# Patient Record
Sex: Female | Born: 1960 | Race: White | Hispanic: No | Marital: Married | Smoking: Former smoker
Health system: Southern US, Community
[De-identification: ages and names within clinical notes are randomized; demographics above are authoritative.]

## PROBLEM LIST (undated history)

## (undated) ENCOUNTER — Emergency Department (HOSPITAL_COMMUNITY): Disposition: A | Discharge status: Home / Self Care | Payer: Managed Care, Other (non HMO)

## (undated) DIAGNOSIS — I2699 Other pulmonary embolism without acute cor pulmonale: Secondary | ICD-10-CM

## (undated) DIAGNOSIS — K219 Gastro-esophageal reflux disease without esophagitis: Secondary | ICD-10-CM

## (undated) DIAGNOSIS — G629 Polyneuropathy, unspecified: Secondary | ICD-10-CM

## (undated) DIAGNOSIS — L719 Rosacea, unspecified: Secondary | ICD-10-CM

## (undated) DIAGNOSIS — I639 Cerebral infarction, unspecified: Secondary | ICD-10-CM

## (undated) DIAGNOSIS — K52839 Microscopic colitis, unspecified: Secondary | ICD-10-CM

## (undated) DIAGNOSIS — Z8674 Personal history of sudden cardiac arrest: Secondary | ICD-10-CM

## (undated) HISTORY — DX: Polyneuropathy, unspecified: G62.9

## (undated) HISTORY — PX: CANNULATION FOR CARDIOPULMONARY BYPASS: SHX6411

## (undated) HISTORY — DX: Microscopic colitis, unspecified: K52.839

## (undated) HISTORY — DX: Cerebral infarction, unspecified: I63.9

## (undated) HISTORY — DX: Personal history of sudden cardiac arrest: Z86.74

## (undated) HISTORY — DX: Rosacea, unspecified: L71.9

## (undated) HISTORY — PX: WRIST SURGERY: SHX841

---

## 2010-11-15 HISTORY — PX: COLONOSCOPY WITH ESOPHAGOGASTRODUODENOSCOPY (EGD): SHX5779

## 2012-11-15 HISTORY — PX: IVC FILTER INSERTION: CATH118245

## 2015-04-26 ENCOUNTER — Encounter: Payer: Self-pay | Admitting: Emergency Medicine

## 2015-04-26 ENCOUNTER — Emergency Department
Admission: EM | Admit: 2015-04-26 | Discharge: 2015-04-26 | Disposition: A | Discharge status: Home / Self Care | Payer: Managed Care, Other (non HMO) | Attending: Emergency Medicine | Admitting: Emergency Medicine

## 2015-04-26 ENCOUNTER — Emergency Department: Payer: Managed Care, Other (non HMO)

## 2015-04-26 DIAGNOSIS — S00211A Abrasion of right eyelid and periocular area, initial encounter: Secondary | ICD-10-CM | POA: Diagnosis not present

## 2015-04-26 DIAGNOSIS — S0081XA Abrasion of other part of head, initial encounter: Secondary | ICD-10-CM | POA: Diagnosis not present

## 2015-04-26 DIAGNOSIS — Y9389 Activity, other specified: Secondary | ICD-10-CM | POA: Insufficient documentation

## 2015-04-26 DIAGNOSIS — Y998 Other external cause status: Secondary | ICD-10-CM | POA: Insufficient documentation

## 2015-04-26 DIAGNOSIS — T07XXXA Unspecified multiple injuries, initial encounter: Secondary | ICD-10-CM

## 2015-04-26 DIAGNOSIS — Z87891 Personal history of nicotine dependence: Secondary | ICD-10-CM | POA: Diagnosis not present

## 2015-04-26 DIAGNOSIS — Y9289 Other specified places as the place of occurrence of the external cause: Secondary | ICD-10-CM | POA: Insufficient documentation

## 2015-04-26 DIAGNOSIS — W01198A Fall on same level from slipping, tripping and stumbling with subsequent striking against other object, initial encounter: Secondary | ICD-10-CM | POA: Diagnosis not present

## 2015-04-26 DIAGNOSIS — S0990XA Unspecified injury of head, initial encounter: Secondary | ICD-10-CM

## 2015-04-26 DIAGNOSIS — S6991XA Unspecified injury of right wrist, hand and finger(s), initial encounter: Secondary | ICD-10-CM | POA: Insufficient documentation

## 2015-04-26 DIAGNOSIS — S80811A Abrasion, right lower leg, initial encounter: Secondary | ICD-10-CM | POA: Diagnosis not present

## 2015-04-26 DIAGNOSIS — Z23 Encounter for immunization: Secondary | ICD-10-CM | POA: Diagnosis not present

## 2015-04-26 HISTORY — DX: Other pulmonary embolism without acute cor pulmonale: I26.99

## 2015-04-26 MED ORDER — ACETAMINOPHEN 325 MG PO TABS
650.0000 mg | ORAL_TABLET | Freq: Once | ORAL | Status: AC
  Administered 2015-04-26: 650 mg via ORAL

## 2015-04-26 MED ORDER — BACITRACIN 500 UNIT/GM EX OINT
1.0000 "application " | TOPICAL_OINTMENT | Freq: Two times a day (BID) | CUTANEOUS | Status: DC
  Administered 2015-04-26: 1 via TOPICAL

## 2015-04-26 MED ORDER — TETANUS-DIPHTH-ACELL PERTUSSIS 5-2.5-18.5 LF-MCG/0.5 IM SUSP
0.5000 mL | Freq: Once | INTRAMUSCULAR | Status: AC
  Administered 2015-04-26: 0.5 mL via INTRAMUSCULAR

## 2015-04-26 MED ORDER — BACITRACIN ZINC 500 UNIT/GM EX OINT
TOPICAL_OINTMENT | CUTANEOUS | Status: AC
  Administered 2015-04-26: 1 via TOPICAL
  Filled 2015-04-26: qty 0.9

## 2015-04-26 MED ORDER — ACETAMINOPHEN 325 MG PO TABS
ORAL_TABLET | ORAL | Status: AC
  Administered 2015-04-26: 650 mg via ORAL
  Filled 2015-04-26: qty 2

## 2015-04-26 MED ORDER — TETANUS-DIPHTH-ACELL PERTUSSIS 5-2.5-18.5 LF-MCG/0.5 IM SUSP
INTRAMUSCULAR | Status: AC
  Administered 2015-04-26: 0.5 mL via INTRAMUSCULAR
  Filled 2015-04-26: qty 0.5

## 2015-04-26 NOTE — ED Notes (Signed)
Pt is on eliquis, pt missed the curb, fell hitting her head, face, and rt knee, pt states that she needs to have her head checked because of the blood thinner she is on, pt denies loc

## 2015-04-26 NOTE — Discharge Instructions (Signed)
Concussion A concussion, or closed-head injury, is a brain injury caused by a direct blow to the head or by a quick and sudden movement (jolt) of the head or neck. Concussions are usually not life-threatening. Even so, the effects of a concussion can be serious. If you have had a concussion before, you are more likely to experience concussion-like symptoms after a direct blow to the head.  CAUSES  Direct blow to the head, such as from running into another player during a soccer game, being hit in a fight, or hitting your head on a hard surface.  A jolt of the head or neck that causes the brain to move back and forth inside the skull, such as in a car crash. SIGNS AND SYMPTOMS The signs of a concussion can be hard to notice. Early on, they may be missed by you, family members, and health care providers. You may look fine but act or feel differently. Symptoms are usually temporary, but they may last for days, weeks, or even longer. Some symptoms may appear right away while others may not show up for hours or days. Every head injury is different. Symptoms include:  Mild to moderate headaches that will not go away.  A feeling of pressure inside your head.  Having more trouble than usual:  Learning or remembering things you have heard.  Answering questions.  Paying attention or concentrating.  Organizing daily tasks.  Making decisions and solving problems.  Slowness in thinking, acting or reacting, speaking, or reading.  Getting lost or being easily confused.  Feeling tired all the time or lacking energy (fatigued).  Feeling drowsy.  Sleep disturbances.  Sleeping more than usual.  Sleeping less than usual.  Trouble falling asleep.  Trouble sleeping (insomnia).  Loss of balance or feeling lightheaded or dizzy.  Nausea or vomiting.  Numbness or tingling.  Increased sensitivity to:  Sounds.  Lights.  Distractions.  Vision problems or eyes that tire  easily.  Diminished sense of taste or smell.  Ringing in the ears.  Mood changes such as feeling sad or anxious.  Becoming easily irritated or angry for little or no reason.  Lack of motivation.  Seeing or hearing things other people do not see or hear (hallucinations). DIAGNOSIS Your health care provider can usually diagnose a concussion based on a description of your injury and symptoms. He or she will ask whether you passed out (lost consciousness) and whether you are having trouble remembering events that happened right before and during your injury. Your evaluation might include:  A brain scan to look for signs of injury to the brain. Even if the test shows no injury, you may still have a concussion.  Blood tests to be sure other problems are not present. TREATMENT  Concussions are usually treated in an emergency department, in urgent care, or at a clinic. You may need to stay in the hospital overnight for further treatment.  Tell your health care provider if you are taking any medicines, including prescription medicines, over-the-counter medicines, and natural remedies. Some medicines, such as blood thinners (anticoagulants) and aspirin, may increase the chance of complications. Also tell your health care provider whether you have had alcohol or are taking illegal drugs. This information may affect treatment.  Your health care provider will send you home with important instructions to follow.  How fast you will recover from a concussion depends on many factors. These factors include how severe your concussion is, what part of your brain was injured, your  age, and how healthy you were before the concussion. °· Most people with mild injuries recover fully. Recovery can take time. In general, recovery is slower in older persons. Also, persons who have had a concussion in the past or have other medical problems may find that it takes longer to recover from their current injury. °HOME  CARE INSTRUCTIONS °General Instructions °· Carefully follow the directions your health care provider gave you. °· Only take over-the-counter or prescription medicines for pain, discomfort, or fever as directed by your health care provider. °· Take only those medicines that your health care provider has approved. °· Do not drink alcohol until your health care provider says you are well enough to do so. Alcohol and certain other drugs may slow your recovery and can put you at risk of further injury. °· If it is harder than usual to remember things, write them down. °· If you are easily distracted, try to do one thing at a time. For example, do not try to watch TV while fixing dinner. °· Talk with family members or close friends when making important decisions. °· Keep all follow-up appointments. Repeated evaluation of your symptoms is recommended for your recovery. °· Watch your symptoms and tell others to do the same. Complications sometimes occur after a concussion. Older adults with a brain injury may have a higher risk of serious complications, such as a blood clot on the brain. °· Tell your teachers, school nurse, school counselor, coach, athletic trainer, or work manager about your injury, symptoms, and restrictions. Tell them about what you can or cannot do. They should watch for: °¨ Increased problems with attention or concentration. °¨ Increased difficulty remembering or learning new information. °¨ Increased time needed to complete tasks or assignments. °¨ Increased irritability or decreased ability to cope with stress. °¨ Increased symptoms. °· Rest. Rest helps the brain to heal. Make sure you: °¨ Get plenty of sleep at night. Avoid staying up late at night. °¨ Keep the same bedtime hours on weekends and weekdays. °¨ Rest during the day. Take daytime naps or rest breaks when you feel tired. °· Limit activities that require a lot of thought or concentration. These include: °¨ Doing homework or job-related  work. °¨ Watching TV. °¨ Working on the computer. °· Avoid any situation where there is potential for another head injury (football, hockey, soccer, basketball, martial arts, downhill snow sports and horseback riding). Your condition will get worse every time you experience a concussion. You should avoid these activities until you are evaluated by the appropriate follow-up health care providers. °Returning To Your Regular Activities °You will need to return to your normal activities slowly, not all at once. You must give your body and brain enough time for recovery. °· Do not return to sports or other athletic activities until your health care provider tells you it is safe to do so. °· Ask your health care provider when you can drive, ride a bicycle, or operate heavy machinery. Your ability to react may be slower after a brain injury. Never do these activities if you are dizzy. °· Ask your health care provider about when you can return to work or school. °Preventing Another Concussion °It is very important to avoid another brain injury, especially before you have recovered. In rare cases, another injury can lead to permanent brain damage, brain swelling, or death. The risk of this is greatest during the first 7-10 days after a head injury. Avoid injuries by: °· Wearing a seat   belt when riding in a car.  Drinking alcohol only in moderation.  Wearing a helmet when biking, skiing, skateboarding, skating, or doing similar activities.  Avoiding activities that could lead to a second concussion, such as contact or recreational sports, until your health care provider says it is okay.  Taking safety measures in your home.  Remove clutter and tripping hazards from floors and stairways.  Use grab bars in bathrooms and handrails by stairs.  Place non-slip mats on floors and in bathtubs.  Improve lighting in dim areas. SEEK MEDICAL CARE IF:  You have increased problems paying attention or  concentrating.  You have increased difficulty remembering or learning new information.  You need more time to complete tasks or assignments than before.  You have increased irritability or decreased ability to cope with stress.  You have more symptoms than before. Seek medical care if you have any of the following symptoms for more than 2 weeks after your injury:  Lasting (chronic) headaches.  Dizziness or balance problems.  Nausea.  Vision problems.  Increased sensitivity to noise or light.  Depression or mood swings.  Anxiety or irritability.  Memory problems.  Difficulty concentrating or paying attention.  Sleep problems.  Feeling tired all the time. SEEK IMMEDIATE MEDICAL CARE IF:  You have severe or worsening headaches. These may be a sign of a blood clot in the brain.  You have weakness (even if only in one hand, leg, or part of the face).  You have numbness.  You have decreased coordination.  You vomit repeatedly.  You have increased sleepiness.  One pupil is larger than the other.  You have convulsions.  You have slurred speech.  You have increased confusion. This may be a sign of a blood clot in the brain.  You have increased restlessness, agitation, or irritability.  You are unable to recognize people or places.  You have neck pain.  It is difficult to wake you up.  You have unusual behavior changes.  You lose consciousness. MAKE SURE YOU:  Understand these instructions.  Will watch your condition.  Will get help right away if you are not doing well or get worse. Document Released: 01/22/2004 Document Revised: 11/06/2013 Document Reviewed: 05/24/2013 [facility] Patient Information 2015 [place], [place]. This information is not intended to replace advice given to you by your health care provider. Make sure you discuss any questions you have with your health care provider.  Abrasions An abrasion is a cut or scrape of the skin. Abrasions  do not go through all layers of the skin. HOME CARE  If a bandage (dressing) was put on your wound, change it as told by your doctor. If the bandage sticks, soak it off with warm.  Wash the area with water and soap 2 times a day. Rinse off the soap. Pat the area dry with a clean towel.  Put on medicated cream (ointment) as told by your doctor.  Change your bandage right away if it gets wet or dirty.  Only take medicine as told by your doctor.  See your doctor within 24-48 hours to get your wound checked.  Check your wound for redness, puffiness (swelling), or yellowish-white fluid (pus). GET HELP RIGHT AWAY IF:   You have more pain in the wound.  You have redness, swelling, or tenderness around the wound.  You have pus coming from the wound.  You have a fever or lasting symptoms for more than 2-3 days.  You have a fever and your  symptoms suddenly get worse.  You have a bad smell coming from the wound or bandage. MAKE SURE YOU:   Understand these instructions.  Will watch your condition.  Will get help right away if you are not doing well or get worse. Document Released: 04/19/2008 Document Revised: 07/26/2012 Document Reviewed: 10/05/2011 [facility] Patient Information 2015 [place], [place]. This information is not intended to replace advice given to you by your health care provider. Make sure you discuss any questions you have with your health care provider.

## 2015-04-26 NOTE — ED Provider Notes (Signed)
CSN: [id number]     Arrival date & time 04/26/15  1025 History   First MD Initiated Contact with Patient 04/26/15 1045     Chief Complaint  Patient presents with  . Head Injury     (Consider location/radiation/quality/duration/timing/severity/associated sxs/prior Treatment) HPI  History of present illness patient was taking out her garbage prior to arrival slipped and fell down striking her right knee and then her head on a curb she had no loss of consciousness but she is on blood thinners due to history of pulmonary embolus is concerned about intracranial bleed and injury overall rates her pain as approximately a 7 out of 10 worse with certain movements describes as an achy pain denies dizziness blurred vision nausea disorientation and is here today for evaluation of her wounds and intercranial bleed   Past Medical History  Diagnosis Date  . Pulmonary embolism     2014 pt went into cardiac arrest r/t pulmonary emboli  . PE (pulmonary embolism)     2013   Past Surgical History  Procedure Laterality Date  . Wrist surgery  left   No family history on file. History  Substance Use Topics  . Smoking status: Former Games developer  . Smokeless tobacco: Not on file  . Alcohol Use: 1.2 oz/week    2 Standard drinks or equivalent per week   OB History    No data available     Review of Systems  Constitutional: Negative.   HENT: Negative.   Eyes: Negative.   Respiratory: Negative.   Cardiovascular: Negative.   Musculoskeletal: Negative.   Skin: Negative.   Neurological: Negative.   All other systems reviewed and are negative.      Allergies  Review of patient's allergies indicates no known allergies.  Home Medications   Prior to Admission medications   Not on File   BP 110/72 mmHg  Pulse 64  Temp(Src) 98.2 F (36.8 C) (Oral)  Resp 14  Ht 5\' 6"  (1.676 m)  Wt 170 lb (77.111 kg)  BMI 27.45 kg/m2  SpO2 100%  LMP 04/15/2015 Physical Exam  Female appearing stated age  well-developed well-nourished no acute distress Vitals reviewed Head ears eyes nose neck and throat examination reveals some right sided facial abrasions pupils equal round reactive to light and accommodation extraocular motions are intact Cardiovascular regular rate and rhythm no murmurs rubs gallops Pulmonary lungs clear to auscultation bilaterally Neuro exams nonfocal cranial nerves II through XII grossly intact Musculoskeletal he she has pain with palpation to her right wrist pain with palpation to her right shin No functional deficits noted full range of motion of all extremities Skin patient has abrasion to right proximal shin anteriorly and abrasions to her right eyebrow and cheek Psychologically very pleasant and acting appropriately       ED Course  Procedures patient's wounds were cleaned and dressed by nursing staff Labs Review Labs Reviewed - No data to display  Imaging Review Dg Wrist Complete Right  04/26/2015   CLINICAL DATA:  Fall today with right wrist pain and swelling. Initial encounter.  EXAM: RIGHT WRIST - COMPLETE 3+ VIEW  COMPARISON:  None.  FINDINGS: No acute fracture or dislocation. Scaphoid intact. Diffuse soft tissue swelling is identified about the wrist on the lateral view.  IMPRESSION: Soft tissue swelling, without acute osseous abnormality.   Electronically Signed   By: Jeronimo Greaves M.D.   On: 04/26/2015 12:09   Ct Head Wo Contrast  04/26/2015   CLINICAL DATA:  Recent fall  while on blood thinners, initial encounter  EXAM: CT HEAD WITHOUT CONTRAST  TECHNIQUE: Contiguous axial images were obtained from the base of the skull through the vertex without intravenous contrast.  COMPARISON:  None.  FINDINGS: Bony calvarium is intact. Mild atrophic changes are noted. No findings to suggest acute hemorrhage, acute infarction or space-occupying mass lesion are noted. 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  IMPRESSION: No acute intracranial  abnormality noted.   Electronically Signed   By: Alcide Clever M.D.   On: 04/26/2015 12:10     EKG Interpretation None      MDM  Decision making on this patient giving negative x-rays negative head CT fill comfortable discharging her home with head injury precautions at home take Tylenol as needed for pain clean and dress wounds over the next couple days return here for any acute concerns or worsening symptoms Final diagnoses:  Abrasions of multiple sites  Minor head injury without loss of consciousness, initial encounter        Nashya Garlington Rosalyn Gess, PA-C 04/26/15 1357  Governor Rooks, MD 04/26/15 1521

## 2015-04-26 NOTE — ED Notes (Signed)
R knee wound cleansed, small grit and grass removed, antibiotic ung and DSD applied.

## 2015-04-26 NOTE — ED Notes (Signed)
Fell and hit head while taking garbage, hit head on cement, headache and L knee pain.

## 2015-10-05 ENCOUNTER — Emergency Department
Admission: EM | Admit: 2015-10-05 | Discharge: 2015-10-05 | Disposition: A | Discharge status: Home / Self Care | Payer: Managed Care, Other (non HMO) | Attending: Emergency Medicine | Admitting: Emergency Medicine

## 2015-10-05 ENCOUNTER — Emergency Department: Payer: Managed Care, Other (non HMO)

## 2015-10-05 ENCOUNTER — Encounter: Payer: Self-pay | Admitting: Emergency Medicine

## 2015-10-05 DIAGNOSIS — S99922A Unspecified injury of left foot, initial encounter: Secondary | ICD-10-CM | POA: Diagnosis present

## 2015-10-05 DIAGNOSIS — S9032XA Contusion of left foot, initial encounter: Secondary | ICD-10-CM | POA: Insufficient documentation

## 2015-10-05 DIAGNOSIS — Z87891 Personal history of nicotine dependence: Secondary | ICD-10-CM | POA: Insufficient documentation

## 2015-10-05 DIAGNOSIS — Y9389 Activity, other specified: Secondary | ICD-10-CM | POA: Insufficient documentation

## 2015-10-05 DIAGNOSIS — Y998 Other external cause status: Secondary | ICD-10-CM | POA: Diagnosis not present

## 2015-10-05 DIAGNOSIS — W01198A Fall on same level from slipping, tripping and stumbling with subsequent striking against other object, initial encounter: Secondary | ICD-10-CM | POA: Diagnosis not present

## 2015-10-05 DIAGNOSIS — Y92002 Bathroom of unspecified non-institutional (private) residence single-family (private) house as the place of occurrence of the external cause: Secondary | ICD-10-CM | POA: Diagnosis not present

## 2015-10-05 MED ORDER — HYDROCODONE-ACETAMINOPHEN 5-325 MG PO TABS: 1.0000 | ORAL_TABLET | ORAL | Status: DC | PRN

## 2015-10-05 MED ORDER — NAPROXEN 500 MG PO TBEC: 500.0000 mg | DELAYED_RELEASE_TABLET | Freq: Two times a day (BID) | ORAL | Status: DC

## 2015-10-05 MED ORDER — HYDROCODONE-ACETAMINOPHEN 5-325 MG PO TABS
2.0000 | ORAL_TABLET | Freq: Once | ORAL | Status: AC
  Administered 2015-10-05: 2 via ORAL
  Filled 2015-10-05: qty 2

## 2015-10-05 NOTE — Discharge Instructions (Signed)

## 2015-10-05 NOTE — ED Provider Notes (Signed)
[facility] Emergency Department Provider Note  ____________________________________________  Time seen: Approximately 5:00 PM  I have reviewed the triage vital signs and the nursing notes.   HISTORY  Chief Complaint Fall   HPI Marvalene Hassey is a 54 y.o. female since for evaluation of left foot pain. Patient states that she slipped and bathroom floor this morning her left back on her body. Complains of pain dorsally and plantarly.   Past Medical History  Diagnosis Date  . Pulmonary embolism (HCC)     2014 pt went into cardiac arrest r/t pulmonary emboli  . PE (pulmonary embolism)     2013    There are no active problems to display for this patient.   Past Surgical History  Procedure Laterality Date  . Wrist surgery  left    Current Outpatient Rx  Name  Route  Sig  Dispense  Refill  . HYDROcodone-acetaminophen (NORCO) 5-325 MG tablet   Oral   Take 1-2 tablets by mouth every 4 (four) hours as needed for moderate pain.   15 tablet   0   . naproxen (EC NAPROSYN) 500 MG EC tablet   Oral   Take 1 tablet (500 mg total) by mouth 2 (two) times daily with a meal.   60 tablet   0     Allergies Review of patient's allergies indicates no known allergies.  History reviewed. No pertinent family history.  Social History Social History  Substance Use Topics  . Smoking status: Former Games developer  . Smokeless tobacco: None  . Alcohol Use: 1.2 oz/week    2 Standard drinks or equivalent per week    Review of Systems Constitutional: No fever/chills Eyes: No visual changes. ENT: No sore throat. Cardiovascular: Denies chest pain. Respiratory: Denies shortness of breath. Gastrointestinal: No abdominal pain.  No nausea, no vomiting.  No diarrhea.  No constipation. Genitourinary: Negative for dysuria. Musculoskeletal: Positive for left foot pain. Skin: Negative for rash. Neurological: Negative for headaches, focal weakness or numbness.  10-point  ROS otherwise negative.  ____________________________________________   PHYSICAL EXAM:  VITAL SIGNS: ED Triage Vitals  Enc Vitals Group     BP 10/05/15 1642 145/70 mmHg     Pulse Rate 10/05/15 1642 70     Resp 10/05/15 1642 18     Temp 10/05/15 1642 98.2 F (36.8 C)     Temp Source 10/05/15 1642 Oral     SpO2 10/05/15 1642 100 %     Weight 10/05/15 1642 180 lb (81.647 kg)     Height 10/05/15 1642  (1.676 m)     Head Cir --      Peak Flow --      Pain Score 10/05/15 1642 9     Pain Loc --      Pain Edu? --      Excl. in GC? --    Constitutional: Alert and oriented. Well appearing and in no acute distress. Musculoskeletal: No lower extremity tenderness nor edema.  No joint effusions. Positive for left foot tenderness distally neurovascularly intact no gross deformity noted. Neurologic:  Normal speech and language. No gross focal neurologic deficits are appreciated. No gait instability. Skin:  Skin is warm, dry and intact. No rash noted. Psychiatric: Mood and affect are normal. Speech and behavior are normal.  ____________________________________________   LABS (all labs ordered are listed, but only abnormal results are displayed)  Labs Reviewed - No data to display  RADIOLOGY  FINDINGS: Mild degenerative change at the first  metatarsal phalangeal joint with subchondral sclerosis and minimal joint space narrowing. No acute fracture or dislocation. ____________________________________________   PROCEDURES  Procedure(s) performed: None  Critical Care performed: No  ____________________________________________   INITIAL IMPRESSION / ASSESSMENT AND PLAN / ED COURSE  Pertinent labs & imaging results that were available during my care of the patient were reviewed by me and considered in my medical decision making (see chart for details).  Acute left foot contusion. Rx given for hydrocodone and Naprosyn for pain control. Patient follow-up with PCP or return  here with any worsening symptomology. Patient voices no other emergency medical complaints at this time. ____________________________________________   FINAL CLINICAL IMPRESSION(S) / ED DIAGNOSES  Final diagnoses:  Foot contusion, left, initial encounter      Evangeline Dakinharles M Rosalina Dingwall, PA-C 10/05/15 1743  Phineas SemenGraydon Goodman, MD 10/05/15 [phone]

## 2015-10-05 NOTE — ED Notes (Signed)
Pt slipped on bathroom floor this am and left leg went up under her. Pt is complaining of pain to left foot.

## 2016-08-28 ENCOUNTER — Encounter: Payer: Self-pay | Admitting: Emergency Medicine

## 2016-08-28 ENCOUNTER — Emergency Department
Admission: EM | Admit: 2016-08-28 | Discharge: 2016-08-28 | Disposition: A | Discharge status: Home / Self Care | Payer: Managed Care, Other (non HMO) | Attending: Emergency Medicine | Admitting: Emergency Medicine

## 2016-08-28 ENCOUNTER — Emergency Department: Payer: Managed Care, Other (non HMO)

## 2016-08-28 DIAGNOSIS — Z87828 Personal history of other (healed) physical injury and trauma: Secondary | ICD-10-CM | POA: Insufficient documentation

## 2016-08-28 DIAGNOSIS — X503XXA Overexertion from repetitive movements, initial encounter: Secondary | ICD-10-CM | POA: Diagnosis not present

## 2016-08-28 DIAGNOSIS — Y9289 Other specified places as the place of occurrence of the external cause: Secondary | ICD-10-CM | POA: Diagnosis not present

## 2016-08-28 DIAGNOSIS — Y999 Unspecified external cause status: Secondary | ICD-10-CM | POA: Diagnosis not present

## 2016-08-28 DIAGNOSIS — S46911A Strain of unspecified muscle, fascia and tendon at shoulder and upper arm level, right arm, initial encounter: Secondary | ICD-10-CM | POA: Insufficient documentation

## 2016-08-28 DIAGNOSIS — Z8739 Personal history of other diseases of the musculoskeletal system and connective tissue: Secondary | ICD-10-CM

## 2016-08-28 DIAGNOSIS — Y9389 Activity, other specified: Secondary | ICD-10-CM | POA: Diagnosis not present

## 2016-08-28 DIAGNOSIS — Z87891 Personal history of nicotine dependence: Secondary | ICD-10-CM | POA: Diagnosis not present

## 2016-08-28 DIAGNOSIS — M25511 Pain in right shoulder: Secondary | ICD-10-CM | POA: Diagnosis present

## 2016-08-28 MED ORDER — CYCLOBENZAPRINE HCL 5 MG PO TABS: 5.0000 mg | ORAL_TABLET | Freq: Three times a day (TID) | ORAL | 0 refills | Status: DC | PRN

## 2016-08-28 NOTE — ED Triage Notes (Signed)
Approx 1 hour ago was reaching under bed and felt like she dislocated R shoulder. States she feels like it "popped" back in but is still painful, Has history of dislocations same shoulder.

## 2016-08-28 NOTE — Discharge Instructions (Signed)
Your exam is normal today and your x-ray is normal. There are mild degenerative changes noted on x-ray. You should follow-up with Dr. Joice LoftsPoggi or your primary care provider as needed. Apply ice and take OTC Tylenol as needed for pain relief.

## 2016-08-28 NOTE — ED Notes (Signed)
States she reached under the bed to get the remote  And felt like she dislocated her shoulder  And felt it pop back in  Increased pain with movement  Positive pulses

## 2016-08-28 NOTE — ED Provider Notes (Signed)
[facility] Emergency Department Provider Note ____________________________________________  Time seen: 1112  I have reviewed the triage vital signs and the nursing notes.  HISTORY  Chief Complaint  Shoulder Pain  HPI Carmen Singleton is a 55 y.o. female presents to the ED for evaluation of right shoulder pain. The right-handed patient describes about an hour prior to arrival that she was reaching under her bed and felt like her right shoulder spontaneously dislocated. She states she feels like it "popped" back into place, but notes it is still painful. She gives a remote history of prior spontaneous dislocations to the same shoulder.  Past Medical History:  Diagnosis Date  . PE (pulmonary embolism)    2013  . Pulmonary embolism (HCC)    2014 pt went into cardiac arrest r/t pulmonary emboli   There are no active problems to display for this patient.   Past Surgical History:  Procedure Laterality Date  . WRIST SURGERY  left    Prior to Admission medications   Medication Sig Start Date End Date Taking? Authorizing Provider  apixaban (ELIQUIS) 5 MG TABS tablet Take 5 mg by mouth 2 (two) times daily.   Yes Historical Provider, MD  cyclobenzaprine (FLEXERIL) 5 MG tablet Take 1 tablet (5 mg total) by mouth 3 (three) times daily as needed for muscle spasms. 08/28/16   Stewart Sasaki V Bacon Jeoffrey Eleazer, PA-C    Allergies Review of patient's allergies indicates no known allergies.  No family history on file.  Social History Social History  Substance Use Topics  . Smoking status: Former Games developermoker  . Smokeless tobacco: Not on file  . Alcohol use 1.2 oz/week    2 Standard drinks or equivalent per week   Review of Systems  Constitutional: Negative for fever. Cardiovascular: Negative for chest pain. Respiratory: Negative for shortness of breath. Musculoskeletal: Negative for back pain. Right shoulder pain as above Skin: Negative for rash. Neurological: Negative for  headaches, focal weakness or numbness. ____________________________________________  PHYSICAL EXAM:  VITAL SIGNS: ED Triage Vitals  Enc Vitals Group     BP 08/28/16 1106 131/72     Pulse Rate 08/28/16 1106 72     Resp 08/28/16 1106 18     Temp 08/28/16 1106 98.1 F (36.7 C)     Temp Source 08/28/16 1106 Oral     SpO2 08/28/16 1106 99 %     Weight 08/28/16 1106 160 lb (72.6 kg)     Height 08/28/16 1106 5\' 6"  (1.676 m)     Head Circumference --      Peak Flow --      Pain Score 08/28/16 1107 9     Pain Loc --      Pain Edu? --      Excl. in GC? --    Constitutional: Alert and oriented. Well appearing and in no distress. Head: Normocephalic and atraumatic. Cardiovascular: Normal distal pulses. Respiratory: Normal respiratory effort.  Musculoskeletal: Right shoulder without obvious deformity, dislocation, or sulcus sign. Patient is minimally tender to palpation over the substernal his musculature as well as the trapezius. She has some mild tenderness to palpation at the anterior deltoid. Patient with normal range of motion to 90 with extension and abduction. Normal internal/external rotation. Nontender with normal range of motion in all extremities.  Neurologic:  Normal gait without ataxia. Normal speech and language. No gross focal neurologic deficits are appreciated. Skin:  Skin is warm, dry and intact. No rash noted. ____________________________________________   RADIOLOGY  Right Shoulder  No acute fracture or dislocation.   I, Louvenia Golomb, Charlesetta Ivory, personally discussed these images and results by phone with the on-call radiologist and used this discussion as part of my medical decision making.  ____________________________________________  INITIAL IMPRESSION / ASSESSMENT AND PLAN / ED COURSE  Patient with an acute right shoulder dislocation with spontaneous reduction. She presents now with range of motion improved and x-ray confirmation of a reduced shoulder. She is  discharged at this time with a prescription for cyclobenzaprine to dose as directed. She will dose over-the-counter Tylenol as needed for additional pain relief. She will follow up with Dr. Joice Lofts or primary care provider for ongoing management. She is advised ice to shoulder and to limit over shoulder level activities until symptoms have resolved.  Clinical Course   ____________________________________________  FINAL CLINICAL IMPRESSION(S) / ED DIAGNOSES  Final diagnoses:  Strain of right shoulder, initial encounter  History of closed shoulder dislocation      Lissa Hoard, PA-C 08/28/16 1216    Arnaldo Natal, MD 08/28/16 1410

## 2016-08-30 ENCOUNTER — Other Ambulatory Visit: Payer: Self-pay | Admitting: Certified Nurse Midwife

## 2016-08-30 ENCOUNTER — Other Ambulatory Visit: Payer: Self-pay | Admitting: Obstetrics & Gynecology

## 2016-08-30 DIAGNOSIS — Z1231 Encounter for screening mammogram for malignant neoplasm of breast: Secondary | ICD-10-CM

## 2016-09-16 HISTORY — PX: COLONOSCOPY: SHX174

## 2016-10-01 ENCOUNTER — Other Ambulatory Visit: Payer: Self-pay | Admitting: Certified Nurse Midwife

## 2016-10-01 ENCOUNTER — Ambulatory Visit
Admission: RE | Admit: 2016-10-01 | Discharge: 2016-10-01 | Disposition: A | Discharge status: Home / Self Care | Payer: Managed Care, Other (non HMO) | Source: Ambulatory Visit | Attending: Certified Nurse Midwife | Admitting: Certified Nurse Midwife

## 2016-10-01 DIAGNOSIS — Z1231 Encounter for screening mammogram for malignant neoplasm of breast: Secondary | ICD-10-CM

## 2016-10-01 DIAGNOSIS — R928 Other abnormal and inconclusive findings on diagnostic imaging of breast: Secondary | ICD-10-CM | POA: Diagnosis not present

## 2016-10-13 ENCOUNTER — Other Ambulatory Visit: Payer: Self-pay | Admitting: *Deleted

## 2016-10-13 ENCOUNTER — Inpatient Hospital Stay
Admission: RE | Admit: 2016-10-13 | Discharge: 2016-10-13 | Disposition: A | Discharge status: Home / Self Care | Payer: Self-pay | Source: Ambulatory Visit | Attending: *Deleted | Admitting: *Deleted

## 2016-10-13 DIAGNOSIS — Z9289 Personal history of other medical treatment: Secondary | ICD-10-CM

## 2016-10-18 ENCOUNTER — Other Ambulatory Visit: Payer: Self-pay | Admitting: Certified Nurse Midwife

## 2016-10-18 DIAGNOSIS — N6320 Unspecified lump in the left breast, unspecified quadrant: Secondary | ICD-10-CM

## 2016-10-18 DIAGNOSIS — N6489 Other specified disorders of breast: Secondary | ICD-10-CM

## 2016-10-22 ENCOUNTER — Ambulatory Visit
Admission: RE | Admit: 2016-10-22 | Discharge: 2016-10-22 | Disposition: A | Discharge status: Home / Self Care | Payer: Managed Care, Other (non HMO) | Source: Ambulatory Visit | Attending: Certified Nurse Midwife | Admitting: Certified Nurse Midwife

## 2016-10-22 DIAGNOSIS — N6489 Other specified disorders of breast: Secondary | ICD-10-CM

## 2016-10-22 DIAGNOSIS — N6002 Solitary cyst of left breast: Secondary | ICD-10-CM | POA: Diagnosis not present

## 2016-10-22 DIAGNOSIS — N6320 Unspecified lump in the left breast, unspecified quadrant: Secondary | ICD-10-CM

## 2016-10-28 ENCOUNTER — Other Ambulatory Visit: Payer: Managed Care, Other (non HMO)

## 2016-10-28 ENCOUNTER — Ambulatory Visit: Payer: Managed Care, Other (non HMO)

## 2016-12-20 ENCOUNTER — Other Ambulatory Visit: Payer: Self-pay | Admitting: Certified Nurse Midwife

## 2016-12-20 DIAGNOSIS — N6489 Other specified disorders of breast: Secondary | ICD-10-CM

## 2016-12-20 DIAGNOSIS — N6320 Unspecified lump in the left breast, unspecified quadrant: Secondary | ICD-10-CM

## 2017-04-25 ENCOUNTER — Ambulatory Visit
Admission: RE | Admit: 2017-04-25 | Discharge: 2017-04-25 | Disposition: A | Discharge status: Home / Self Care | Payer: Managed Care, Other (non HMO) | Source: Ambulatory Visit | Attending: Certified Nurse Midwife | Admitting: Certified Nurse Midwife

## 2017-04-25 ENCOUNTER — Other Ambulatory Visit: Payer: Self-pay | Admitting: Certified Nurse Midwife

## 2017-04-25 DIAGNOSIS — N6320 Unspecified lump in the left breast, unspecified quadrant: Secondary | ICD-10-CM

## 2017-04-25 DIAGNOSIS — N6489 Other specified disorders of breast: Secondary | ICD-10-CM

## 2017-04-25 DIAGNOSIS — N6002 Solitary cyst of left breast: Secondary | ICD-10-CM | POA: Insufficient documentation

## 2017-04-28 ENCOUNTER — Telehealth: Payer: Self-pay | Admitting: Certified Nurse Midwife

## 2017-04-28 ENCOUNTER — Other Ambulatory Visit: Payer: Self-pay | Admitting: Certified Nurse Midwife

## 2017-04-28 DIAGNOSIS — N6489 Other specified disorders of breast: Secondary | ICD-10-CM

## 2017-04-28 NOTE — Telephone Encounter (Signed)
Patient is scheduled at [facility] on Monday, 10/31/17 @ 10:20am. Lmtrc.

## 2017-04-28 NOTE — Telephone Encounter (Signed)
Patient returned the call and is aware of the appt.

## 2017-09-23 ENCOUNTER — Ambulatory Visit (INDEPENDENT_AMBULATORY_CARE_PROVIDER_SITE_OTHER): Payer: Managed Care, Other (non HMO) | Admitting: Certified Nurse Midwife

## 2017-09-23 ENCOUNTER — Encounter: Payer: Self-pay | Admitting: Certified Nurse Midwife

## 2017-09-23 VITALS — BP 120/70 | HR 86 | Ht 66.0 in | Wt 198.0 lb

## 2017-09-23 DIAGNOSIS — Z124 Encounter for screening for malignant neoplasm of cervix: Secondary | ICD-10-CM

## 2017-09-23 DIAGNOSIS — Z01419 Encounter for gynecological examination (general) (routine) without abnormal findings: Secondary | ICD-10-CM

## 2017-09-23 DIAGNOSIS — K52839 Microscopic colitis, unspecified: Secondary | ICD-10-CM | POA: Insufficient documentation

## 2017-09-23 DIAGNOSIS — N951 Menopausal and female climacteric states: Secondary | ICD-10-CM

## 2017-09-23 DIAGNOSIS — Z1231 Encounter for screening mammogram for malignant neoplasm of breast: Secondary | ICD-10-CM | POA: Diagnosis not present

## 2017-09-23 DIAGNOSIS — L719 Rosacea, unspecified: Secondary | ICD-10-CM | POA: Insufficient documentation

## 2017-09-23 DIAGNOSIS — I639 Cerebral infarction, unspecified: Secondary | ICD-10-CM | POA: Insufficient documentation

## 2017-09-23 DIAGNOSIS — I2699 Other pulmonary embolism without acute cor pulmonale: Secondary | ICD-10-CM | POA: Insufficient documentation

## 2017-09-23 DIAGNOSIS — Z1239 Encounter for other screening for malignant neoplasm of breast: Secondary | ICD-10-CM

## 2017-09-23 NOTE — Patient Instructions (Signed)
Preventing Osteoporosis, Adult Osteoporosis is a condition that causes the bones to get weaker. With osteoporosis, the bones become thinner, and the normal spaces in bone tissue become larger. This can make the bones weak and cause them to break more easily. People who have osteoporosis are more likely to break their wrist, spine, or hip. Even a minor accident or injury can be enough to break weak bones. Osteoporosis can occur with aging. Your body constantly replaces old bone tissue with new tissue. As you get older, you may lose bone tissue more quickly, or it may be replaced more slowly. Osteoporosis is more likely to develop if you have poor nutrition or do not get enough calcium or vitamin D. Other lifestyle factors can also play a role. By making some diet and lifestyle changes, you can help to keep your bones healthy and help to prevent osteoporosis. What nutrition changes can be made? Nutrition plays an important role in maintaining healthy, strong bones.  Make sure you get enough calcium every day from food or from calcium supplements. ? If you are age 50 or younger, aim to get 1,000 mg of calcium every day. ? If you are older than age 50, aim to get 1,200 mg of calcium every day.  Try to get enough vitamin D every day. ? If you are age 70 or younger, aim to get 600 international units (IU) every day. ? If you are older than age 70, aim to get 800 international units every day.  Follow a healthy diet. Eat plenty of foods that contain calcium and vitamin D. ? Calcium is in milk, cheese, yogurt, and other dairy products. Some fish and vegetables are also good sources of calcium. Many foods such as cereals and breads have had calcium added to them (are fortified). Check nutrition labels to see how much calcium is in a food or drink. ? Foods that contain vitamin D include milk, cereals, salmon, and tuna. Your body also makes vitamin D when you are out in the sun. Bare skin exposure to the sun on  your face, arms, legs, or back for no more than 30 minutes a day, 2 times per week is more than enough. Beyond that, it is important to use sunblock to protect your skin from sunburn, which increases your risk for skin cancer.  What lifestyle changes can be made? Making changes in your everyday life can also play an important role in preventing osteoporosis.  Stay active and get exercise every day. Ask your health care provider what types of exercise are best for you.  Do not use any products that contain nicotine or tobacco, such as cigarettes and e-cigarettes. If you need help quitting, ask your health care provider.  Limit alcohol intake to no more than 1 drink a day for nonpregnant women and 2 drinks a day for men. One drink equals 12 oz of beer, 5 oz of wine, or 1 oz of hard liquor.  Why are these changes important? Making these nutrition and lifestyle changes can:  Help you develop and maintain healthy, strong bones.  Prevent loss of bone mass and the problems that are caused by that loss, such as broken bones and delayed healing.  Make you feel better mentally and physically.  What can happen if changes are not made? Problems that can result from osteoporosis can be very serious. These may include:  A higher risk of broken bones that are painful and do not heal well.  Physical malformations, such as   a collapsed spine or a hunched back.  Problems with movement.  Where to find support: If you need help making changes to prevent osteoporosis, talk with your health care provider. You can ask for a referral to a diet and nutrition specialist (dietitian) and a physical therapist. Where to find more information: Learn more about osteoporosis from:  NIH Osteoporosis and Related Bone Diseases National Resource Center: www.niams.nih.gov/health_info/bone/osteoporosis/osteoporosis_ff.asp  U.S. Office on Women's Health:  www.womenshealth.gov/publications/our-publications/fact-sheet/osteoporosis.html  National Osteoporosis Foundation: www.nof.org/patients/what-is-osteoporosis/  Summary  Osteoporosis is a condition that causes weak bones that are more likely to break.  Eating a healthy diet and making sure you get enough calcium and vitamin D can help prevent osteoporosis.  Other ways to reduce your risk of osteoporosis include getting regular exercise and avoiding alcohol and products that contain nicotine or tobacco. This information is not intended to replace advice given to you by your health care provider. Make sure you discuss any questions you have with your health care provider. Document Released: 11/16/2015 Document Revised: 07/12/2016 Document Reviewed: 07/12/2016 Elsevier Interactive Patient Education  2018 Elsevier Inc.  

## 2017-09-23 NOTE — Progress Notes (Addendum)
mm    Gynecology Annual Exam  PCP: Tiana Loftabellon, Melissa, MD  Chief Complaint:  Chief Complaint  Patient presents with  . Gynecologic Exam    History of Present Illness:Carmen Singleton presents today for her annual exam. She is a 56 year old postmenopausal female , G 0 P 0 0 0 0 , whose LMP was 04/15/2015 .   She has had no spotting. She has had worse night sweats and hot flashes.  The patient's past medical history is notable for a history of a PE and cardiac arrest on a [place] from [place] in [place]. She then suffered a stroke while in the hospital. A filter was inserted into the vena cava to block any further clots from her legs to travel to her heart, brain or lungs. 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 She currently takes Xarelto and has been stable on this anticoagulant. 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 She also has rosecea and takes doxycycline for this.  Since her last annual GYN exam in 08/24/2016 , she has had a colonoscopy 09/16/2016 and had a tubular adenoma and microscopic colitis.Marland Kitchen. Her next colonoscopy is due in 3 years. She is sexually active. She does not have vaginal dryness.  Last Pap smear: 08/24/2016: NIL  Her most recent mammogram obtained in November 2017 required additional views and ultrasound and revealed a 6 mm area of asymmetry in the right breast and a probable cyst in the left breast (Birads 3 ). Follow up mammogram and ultrasound in 04/25/2017 was also Birads 3. There is a positive history of breast cancer in her paternal aunt. Genetic testing has not been done. There is no family history of ovarian cancer. The patient does not do monthly self breast exams. There is a strong family history of colon cancer : maternal aunt, MGM and father.   A DEXA scan is not applicable for this patient.  The patient does not smoke.  The patient does drink 2 glasses a week.  The patient  does not use illegal drugs.  The patient exercises occasionally.  The patient does not  get adequate calcium in her diet.  She had a recent cholesterol screen in 2017 by PCP Tiana LoftMelissa Cabellon [facility] that was normal.      The patient denies current symptoms of depression.    Review of Systems: Review of Systems  Constitutional: Negative for chills, fever and weight loss.       Positive for weight gain 34# in past year  HENT: Negative for congestion, sinus pain and sore throat.   Eyes: Negative for blurred vision and pain.  Respiratory: Negative for hemoptysis, shortness of breath and wheezing.   Cardiovascular: Negative for chest pain, palpitations and leg swelling.  Gastrointestinal: Positive for heartburn. Negative for abdominal pain, blood in stool, diarrhea, nausea and vomiting.  Genitourinary: Negative for dysuria, frequency, hematuria and urgency.  Musculoskeletal: Negative for back pain, joint pain and myalgias.  Skin: Negative for itching and rash.  Neurological: Negative for dizziness, tingling and headaches.  Endo/Heme/Allergies: Positive for environmental allergies. Negative for polydipsia. Does not bruise/bleed easily.       Negative for hirsutism. Positive for night sweats and hot flashes   Psychiatric/Behavioral: Negative for depression. The patient is not nervous/anxious and does not have insomnia.     Past Medical History:  Past Medical History:  Diagnosis Date  . H/O cardiac arrest   . Microscopic colitis   . 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 Pulmonary embolism (HCC)    2014 pt went into cardiac arrest r/t pulmonary emboli  . Rosacea   . Stroke (cerebrum) [facility](HCC)     Past Surgical History:  Past Surgical History:  Procedure Laterality Date  . CANNULATION FOR CARDIOPULMONARY BYPASS     ECMO  . COLONOSCOPY  09/16/2016   tubular adenoma, microscopic colitis  . COLONOSCOPY WITH ESOPHAGOGASTRODUODENOSCOPY (EGD)  2012  . IVC FILTER INSERTION  2014  .  WRIST SURGERY  left    Family History:  Family History  Problem Relation Age of Onset  . Breast cancer Paternal Aunt   . Colon cancer Father [id number]  . Colon cancer Maternal Aunt 50       colon, rectal, stomach   . Colon cancer Maternal Grandfather 50  . Diabetes Mellitus II Mother   . Diabetes Sister   . Heart attack Sister 343  . Juvenile Diabetes Other        two nephews    Social History:  Social History   Socioeconomic History  . Marital status: Married    Spouse name: Reuel BoomDaniel  . Number of children: 0  . Years of education: Not on file  . Highest education level: Not on file  Social Needs  . Financial resource strain: Not on file  . Food insecurity - worry: Not on file  . Food insecurity - inability: Not on file  . Transportation needs - medical: Not on file  . Transportation needs - non-medical: Not on file  Occupational History  . Occupation: Advertising account plannernsurance Agent  Tobacco Use  . Smoking status: Former Games developermoker  . Smokeless tobacco: Never Used  Substance and Sexual Activity  . Alcohol use: Yes    Alcohol/week: 1.2 oz    Types: 2 Glasses of wine per week  . Drug use: No  . Sexual activity: Yes    Birth control/protection: Post-menopausal  Other Topics Concern  . Not on file  Social History Narrative  . Not on file    Allergies:  No Known Allergies  Medications:  Current Outpatient Medications:  .  AFLURIA QUADRIVALENT 0.5 ML injection, TO BE ADMINISTERED BY PHARMACIST FOR IMMUNIZATION, Disp: , Rfl: 0 .  budesonide (ENTOCORT EC) 3 MG 24 hr capsule, Take 3 mg by mouth daily., Disp: , Rfl:  .  doxycycline (ORACEA) 40 MG capsule, Take 40 mg by mouth 2 (two) times daily., Disp: , Rfl:  .  pantoprazole (PROTONIX) 40 MG tablet, Take 40 mg by mouth daily., Disp: , Rfl:  .  rivaroxaban (XARELTO) 10 MG TABS tablet, Take 10 mg by mouth., Disp: , Rfl:   Currently also using Allegra and Flonase Physical Exam Vitals: BP 120/70   Pulse 86   Ht 5\' 6"  (1.676 m)   Wt 198 lb  (89.8 kg)   LMP 04/16/2015   BMI 31.96 kg/m   General: WF in NAD HEENT: normocephalic, anicteric Neck: no thyroid enlargement, no palpable nodules, no cervical lymphadenopathy  Pulmonary: No increased work of breathing, CTAB Cardiovascular: RRR, without murmur  Breast: Breast symmetrical, no tenderness, no palpable nodules or masses, no skin or nipple retraction present, no nipple discharge.  No axillary, infraclavicular or supraclavicular lymphadenopathy. Abdomen: Soft, non-tender, non-distended.  Umbilicus without lesions.  No hepatomegaly or masses palpable. No evidence of hernia. Genitourinary:  External: Normal external female genitalia.  Normal urethral meatus,  normal Bartholin's and Skene's glands.    Vagina: Normal vaginal mucosa, no evidence of prolapse.    Cervix: Grossly normal in appearance, no bleeding, non-tender  Uterus: Anteverted, normal size, shape, and consistency, mobile, and non-tender  Adnexa: No adnexal masses, non-tender  Rectal: deferred  Lymphatic: no evidence of inguinal lymphadenopathy Extremities: no edema, erythema, or tenderness Neurologic: Grossly intact Psychiatric: mood appropriate, affect full     Assessment: 56 y.o. annual gyn exam Vasomotor symptoms of menopause  Plan:   1) Breast cancer screening - recommend monthly self breast exam. Mammogram and right breast ultrasound has been scheduled for 10/31/2017  2) Colon cancer screening: colonoscopy UTD, next due 2020  3) Cervical cancer screening - Pap was done. ASCCP guidelines and rational discussed.  Patient opts for yearly screening interval  4) Discussed non estrogen containing treatments for vasomotor symptoms, both prescription and OTC. Wants to try vitamin E 400 IU BID  5) Routine healthcare maintenance including cholesterol and diabetes screening managed by PCP . Discussed calcium and vitamin D3 requirements. Also discussed exercises that would help to prevent osteoporosis. Handout  given on foods high in calcium and preventing osteoporosis. Recommend calcium citrate product if she can't get enough calcium in her diet.   6) RTO 1 year and prn.  Farrel Conners, CNM

## 2017-09-24 ENCOUNTER — Encounter: Payer: Self-pay | Admitting: Certified Nurse Midwife

## 2017-09-27 LAB — IGP,RFX APTIMA HPV ALL PTH: PAP Smear Comment: 0

## 2017-10-31 ENCOUNTER — Ambulatory Visit
Admission: RE | Admit: 2017-10-31 | Discharge: 2017-10-31 | Disposition: A | Discharge status: Home / Self Care | Payer: Managed Care, Other (non HMO) | Source: Ambulatory Visit | Attending: Certified Nurse Midwife | Admitting: Certified Nurse Midwife

## 2017-10-31 DIAGNOSIS — N6489 Other specified disorders of breast: Secondary | ICD-10-CM | POA: Diagnosis not present

## 2017-11-01 IMAGING — CR DG FOOT COMPLETE 3+V*L*
1 series · 3 of 3 positions shown · non-contrast
Comparison: None.

CLINICAL DATA: Initial encounter for Twisted left foot/ankle today.
Painful to bear wt, swelling across dorsal left foot with bruising
to the mid foot to the lateral side

EXAM:
LEFT FOOT - COMPLETE 3+ VIEW

[Series 1: dg foot complete left · 0.14mm/px · 3 of 3 slices shown]
[im 1/3]
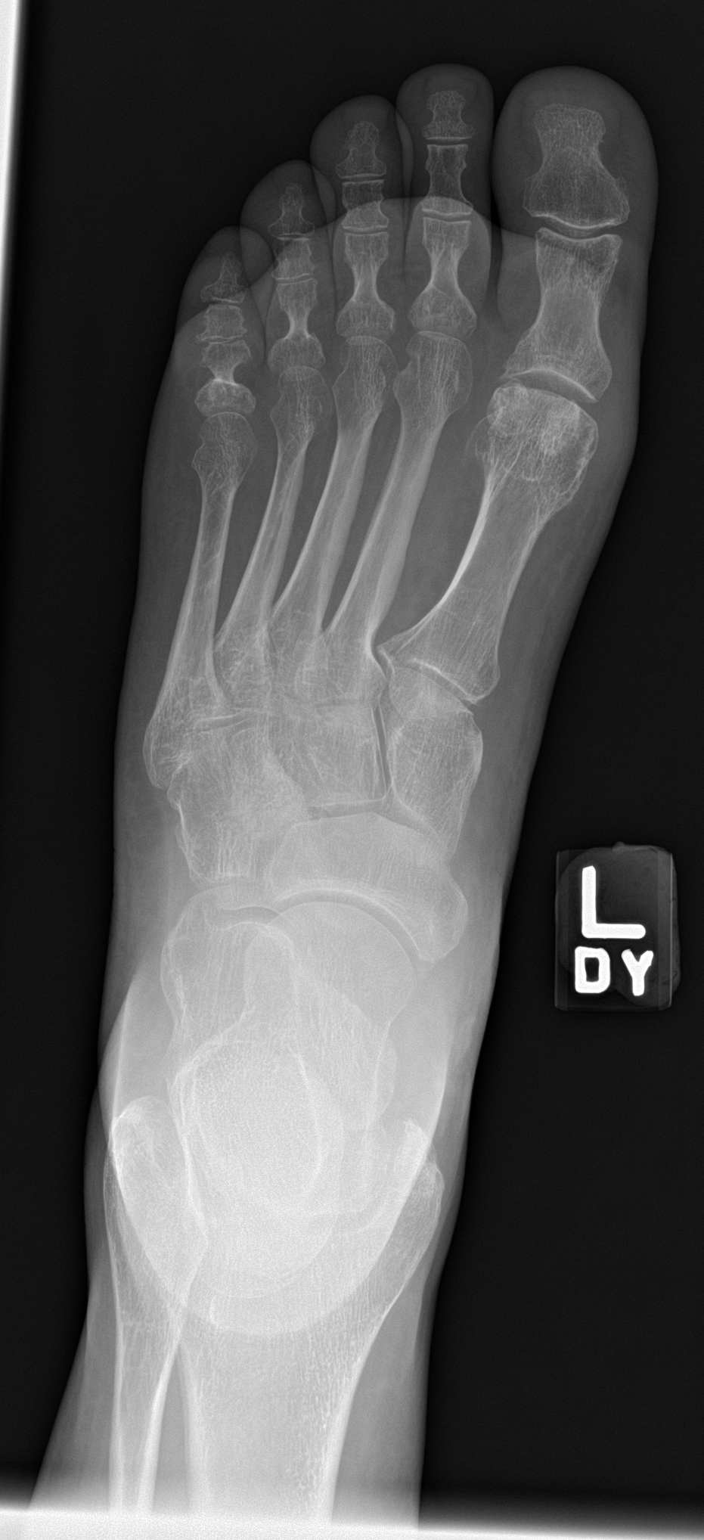
[im 2/3]
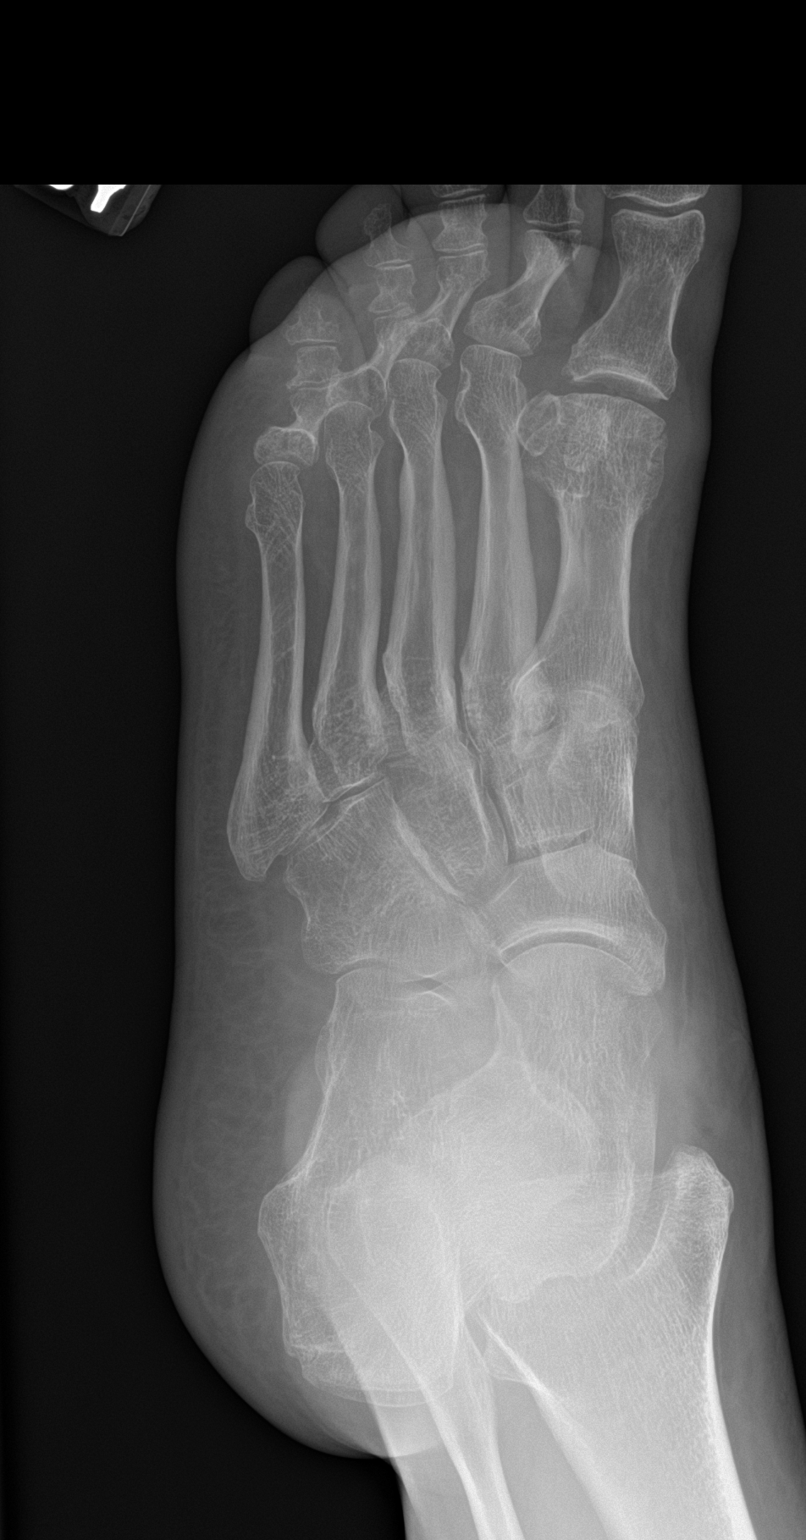
[im 3/3]
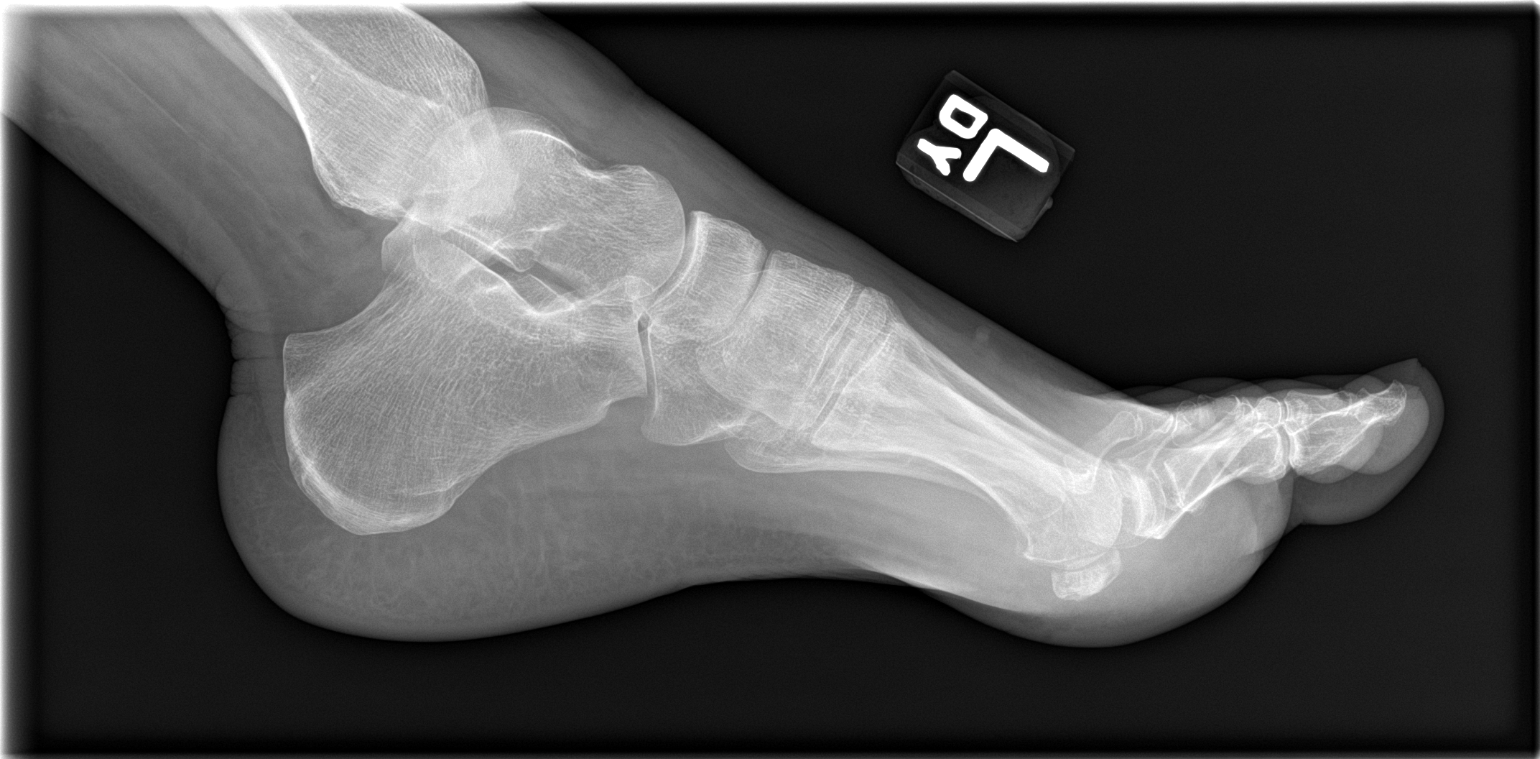

[3 of 3 positions shown; findings below may reference images not displayed]

FINDINGS: Mild degenerative change at the first metatarsal phalangeal joint
with subchondral sclerosis and minimal joint space narrowing. No
acute fracture or dislocation.
IMPRESSION: No acute osseous abnormality.

## 2018-01-11 ENCOUNTER — Encounter: Payer: Self-pay | Admitting: Emergency Medicine

## 2018-01-11 ENCOUNTER — Emergency Department: Payer: Managed Care, Other (non HMO)

## 2018-01-11 ENCOUNTER — Emergency Department
Admission: EM | Admit: 2018-01-11 | Discharge: 2018-01-11 | Disposition: A | Discharge status: Home / Self Care | Payer: Managed Care, Other (non HMO) | Attending: Emergency Medicine | Admitting: Emergency Medicine

## 2018-01-11 DIAGNOSIS — Z7901 Long term (current) use of anticoagulants: Secondary | ICD-10-CM | POA: Insufficient documentation

## 2018-01-11 DIAGNOSIS — Y33XXXA Other specified events, undetermined intent, initial encounter: Secondary | ICD-10-CM | POA: Diagnosis not present

## 2018-01-11 DIAGNOSIS — Z79899 Other long term (current) drug therapy: Secondary | ICD-10-CM | POA: Insufficient documentation

## 2018-01-11 DIAGNOSIS — Y9389 Activity, other specified: Secondary | ICD-10-CM | POA: Insufficient documentation

## 2018-01-11 DIAGNOSIS — Z86711 Personal history of pulmonary embolism: Secondary | ICD-10-CM | POA: Diagnosis not present

## 2018-01-11 DIAGNOSIS — S43004A Unspecified dislocation of right shoulder joint, initial encounter: Secondary | ICD-10-CM | POA: Diagnosis not present

## 2018-01-11 DIAGNOSIS — M25511 Pain in right shoulder: Secondary | ICD-10-CM | POA: Diagnosis present

## 2018-01-11 DIAGNOSIS — Z87891 Personal history of nicotine dependence: Secondary | ICD-10-CM | POA: Insufficient documentation

## 2018-01-11 DIAGNOSIS — Y92003 Bedroom of unspecified non-institutional (private) residence as the place of occurrence of the external cause: Secondary | ICD-10-CM | POA: Insufficient documentation

## 2018-01-11 DIAGNOSIS — Y998 Other external cause status: Secondary | ICD-10-CM | POA: Insufficient documentation

## 2018-01-11 DIAGNOSIS — Z8673 Personal history of transient ischemic attack (TIA), and cerebral infarction without residual deficits: Secondary | ICD-10-CM | POA: Insufficient documentation

## 2018-01-11 MED ORDER — ONDANSETRON HCL 4 MG/2ML IJ SOLN
4.0000 mg | Freq: Once | INTRAMUSCULAR | Status: AC
  Administered 2018-01-11: 4 mg via INTRAVENOUS
  Filled 2018-01-11: qty 2

## 2018-01-11 MED ORDER — PROPOFOL 10 MG/ML IV BOLUS
INTRAVENOUS | Status: AC | PRN
  Administered 2018-01-11: 50 mg via INTRAVENOUS
  Administered 2018-01-11: 30 mg via INTRAVENOUS

## 2018-01-11 MED ORDER — HYDROMORPHONE HCL 1 MG/ML IJ SOLN
0.5000 mg | Freq: Once | INTRAMUSCULAR | Status: AC
  Administered 2018-01-11: 0.5 mg via INTRAVENOUS
  Filled 2018-01-11: qty 1

## 2018-01-11 MED ORDER — MORPHINE SULFATE (PF) 4 MG/ML IV SOLN
4.0000 mg | Freq: Once | INTRAVENOUS | Status: AC
  Administered 2018-01-11: 4 mg via INTRAMUSCULAR
  Filled 2018-01-11: qty 1

## 2018-01-11 MED ORDER — SODIUM CHLORIDE 0.9 % IV BOLUS (SEPSIS)
1000.0000 mL | Freq: Once | INTRAVENOUS | Status: AC
  Administered 2018-01-11: 1000 mL via INTRAVENOUS

## 2018-01-11 MED ORDER — MORPHINE SULFATE (PF) 4 MG/ML IV SOLN: 4.0000 mg | Freq: Once | INTRAVENOUS | Status: DC

## 2018-01-11 MED ORDER — PROPOFOL 10 MG/ML IV BOLUS
INTRAVENOUS | Status: AC
  Filled 2018-01-11: qty 20

## 2018-01-11 MED ORDER — ONDANSETRON 4 MG PO TBDP: 4.0000 mg | ORAL_TABLET | Freq: Once | ORAL | Status: DC

## 2018-01-11 MED ORDER — ONDANSETRON HCL 4 MG/2ML IJ SOLN: 4.0000 mg | Freq: Once | INTRAMUSCULAR | Status: DC

## 2018-01-11 MED ORDER — SODIUM CHLORIDE 0.9 % IV SOLN
INTRAVENOUS | Status: AC | PRN
  Administered 2018-01-11: 100 mL/h via INTRAVENOUS

## 2018-01-11 NOTE — ED Triage Notes (Addendum)
Patient presents to ED via POV from home. Patient reports she was reaching for her alarm clock when her right shoulder "popped" out. Hx of same, last time was a year ago. Positive sensation and pulses bilaterally.

## 2018-01-11 NOTE — ED Notes (Signed)
Iv attempt x 1 by myself, and 2 attempts by GM. Dr Pershing ProudSchaevitz notified.

## 2018-01-11 NOTE — ED Notes (Signed)
Pt alert and oriented X4, active, cooperative, pt in NAD. RR even and unlabored, color WNL.  Pt informed to return if any life threatening symptoms occur.  Discharge and followup instructions reviewed. Left with husband.

## 2018-01-11 NOTE — ED Notes (Signed)
ED Provider at bedside. 

## 2018-01-11 NOTE — ED Notes (Signed)
Pt able to drink without emesis. 

## 2018-01-11 NOTE — ED Provider Notes (Signed)
[facility] Emergency Department Provider Note  ___________________________________________   First MD Initiated Contact with Patient 01/11/18 [phone]     (approximate)  I have reviewed the triage vital signs and the nursing notes.   HISTORY  Chief Complaint Shoulder Pain   HPI Carmen Singleton is a 57 y.o. 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  The patient says that this feels exactly how it usually does when her shoulder is dislocated.  She is having pain to the right shoulder but without complaints of numbness down the right arm.  No comments of weakness to the right upper extremity as well.  Says that she has never had surgery for the shoulder.  Patient last ate at 6 PM last night and has not taken any of her medications this morning.  Past Medical History:  Diagnosis Date  . H/O cardiac arrest   . Microscopic colitis   . 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 Pulmonary embolism (HCC)    2014 pt went into cardiac arrest r/t pulmonary emboli  . Rosacea   . Stroke (cerebrum) [facility])     Patient Active Problem List   Diagnosis Date Noted  . Pulmonary embolism (HCC)   . Stroke (cerebrum) (HCC)   . Microscopic colitis   . Rosacea     Past Surgical History:  Procedure Laterality Date  . CANNULATION FOR CARDIOPULMONARY BYPASS     ECMO  . COLONOSCOPY  09/16/2016   tubular adenoma, microscopic colitis  . COLONOSCOPY WITH ESOPHAGOGASTRODUODENOSCOPY (EGD)  2012  . IVC FILTER INSERTION  2014  . WRIST SURGERY  left    Prior to Admission medications   Medication Sig Start Date End Date Taking? Authorizing Provider  AFLURIA QUADRIVALENT 0.5 ML injection TO BE ADMINISTERED BY PHARMACIST FOR IMMUNIZATION 08/23/17   [provider]  budesonide (ENTOCORT EC) 3 MG 24 hr capsule Take 3 mg by mouth  daily.    [provider]  doxycycline (ORACEA) 40 MG capsule Take 40 mg by mouth 2 (two) times daily.    [provider]  pantoprazole (PROTONIX) 40 MG tablet Take 40 mg by mouth daily.    [provider]  rivaroxaban (XARELTO) 10 MG TABS tablet Take 10 mg by mouth. 07/13/17   [provider]    Allergies Patient has no known allergies.  Family History  Problem Relation Age of Onset  . Breast cancer Paternal Aunt   . Colon cancer Father 51  . Colon cancer Maternal Aunt 50       colon, rectal, stomach   . Colon cancer Maternal Grandfather 50  . Diabetes Mellitus II Mother   . Diabetes Sister   . Heart attack Sister 66  . Juvenile Diabetes Other        two nephews    Social History Social History   Tobacco Use  . Smoking status: Former Games developer  . Smokeless tobacco: Never Used  Substance Use Topics  . Alcohol use: Yes    Alcohol/week: 1.2 oz    Types: 2 Glasses of wine per week  . Drug use: No    Review of Systems  Constitutional: No fever/chills Eyes: No visual changes. ENT: No sore throat. Cardiovascular: Denies chest pain. Respiratory: Denies shortness of breath. Gastrointestinal: No abdominal pain.  No nausea, no vomiting.  No  diarrhea.  No constipation. Genitourinary: Negative for dysuria. Musculoskeletal: Negative for back pain. Skin: Negative for rash. Neurological: Negative for headaches, focal weakness or numbness.   ____________________________________________   PHYSICAL EXAM:  VITAL SIGNS: ED Triage Vitals  Enc Vitals Group     BP 01/11/18 0757 123/80     Pulse Rate 01/11/18 0756 74     Resp 01/11/18 0756 (!) 25     Temp 01/11/18 0756 98 F (36.7 C)     Temp Source 01/11/18 0756 Oral     SpO2 01/11/18 0756 100 %     Weight 01/11/18 0756 190 lb (86.2 kg)     Height 01/11/18 0756 5\' 6"  (1.676 m)     Head Circumference --      Peak Flow --      Pain Score 01/11/18 0756 10     Pain Loc --      Pain Edu? --        Excl. in GC? --     Constitutional: Alert and oriented.  Patient appears uncomfortable. Eyes: Conjunctivae are normal.  Head: Atraumatic. Nose: No congestion/rhinnorhea. Mouth/Throat: Mucous membranes are moist.  Neck: No stridor.   Cardiovascular: Normal rate, regular rhythm. Grossly normal heart sounds.  Good peripheral circulation with intact right upper extremity radial pulse. Respiratory: Normal respiratory effort.  No retractions. Lungs CTAB. Gastrointestinal: Soft and nontender. No distention.  Musculoskeletal: No lower extremity tenderness nor edema.  No joint effusions.  Right shoulder with noted deformity and patient holding the right upper extremity in an 80 ducted and internally rotated position with her left hand over the shoulder joint.  There is no sensation deficit to light touch over the deltoid on the right.  5 out of 5 grip strength on the right as well.  Neurologic:  Normal speech and language. No gross focal neurologic deficits are appreciated. Skin:  Skin is warm, dry and intact. No rash noted. Psychiatric: Mood and affect are normal. Speech and behavior are normal.  ____________________________________________   LABS (all labs ordered are listed, but only abnormal results are displayed)  Labs Reviewed - No data to display ____________________________________________  EKG   ____________________________________________  RADIOLOGY  Anterior right shoulder dislocation ____________________________________________   PROCEDURES  Procedure(s) performed:    Reduction of dislocation Date/Time: 01/11/2018 8:12 AM Performed by: Myrna Blazer, MD Authorized by: Myrna Blazer, MD  Consent: Verbal consent obtained. Written consent obtained. Risks and benefits: risks, benefits and alternatives were discussed Consent given by: patient Patient understanding: patient states understanding of the procedure being performed Patient  consent: the patient's understanding of the procedure matches consent given Procedure consent: procedure consent matches procedure scheduled Relevant documents: relevant documents present and verified Test results: test results available and properly labeled Site marked: the operative site was marked Imaging studies: imaging studies available Required items: required blood products, implants, devices, and special equipment available Patient identity confirmed: verbally with patient Time out: Immediately prior to procedure a "time out" was called to verify the correct patient, procedure, equipment, support staff and site/side marked as required. Local anesthesia used: no  Anesthesia: Local anesthesia used: no  Sedation: Patient sedated: yes Sedatives: propofol Sedation end date/time: 01/11/2018 11:13 AM Vitals: Vital signs were monitored during sedation.  Patient tolerance: Patient tolerated the procedure well with no immediate complications  Angiocath insertion Date/Time: 01/11/2018 9:24 AM Performed by: Myrna Blazer, MD Authorized by: Myrna Blazer, MD  Consent: Verbal consent obtained. Risks and benefits: risks, benefits and alternatives  were discussed Consent given by: patient Patient understanding: patient states understanding of the procedure being performed Patient identity confirmed: verbally with patient Local anesthesia used: no  Anesthesia: Local anesthesia used: no  Sedation: Patient sedated: no  Comments: US guided left forearm 20g angiocath inserted with good blood return.  2nd attempt.    .Sedation Date/Time: 01/11/2018 9:52 AM Performed by: Myrna Blazer, MD Authorized by: Myrna Blazer, MD   Consent:    Consent obtained:  Written   Consent given by:  Patient   Risks discussed:  Inadequate sedation, nausea, prolonged hypoxia resulting in organ damage, prolonged sedation necessitating reversal, respiratory  compromise necessitating ventilatory assistance and intubation and vomiting   Alternatives discussed:  Analgesia without sedation and anxiolysis Universal protocol:    Procedure explained and questions answered to patient or proxy's satisfaction: yes     Relevant documents present and verified: yes     Test results available and properly labeled: yes     Imaging studies available: yes     Required blood products, implants, devices, and special equipment available: yes     Immediately prior to procedure a time out was called: yes     Patient identity confirmation method:  Arm band and verbally with patient Indications:    Procedure performed:  Dislocation reduction   Intended level of sedation:  Moderate (conscious sedation) Pre-sedation assessment:    Time since last food or drink:  12 hours   ASA classification: class 2 - patient with mild systemic disease     Mallampati score:  I - soft palate, uvula, fauces, pillars visible   Pre-sedation assessments completed and reviewed: airway patency, cardiovascular function, hydration status, mental status, nausea/vomiting, pain level, respiratory function and temperature     Pre-sedation assessment completed:  01/11/2018 9:53 AM Immediate pre-procedure details:    Reassessment: Patient reassessed immediately prior to procedure     Reviewed: vital signs, relevant labs/tests and NPO status     Verified: bag valve mask available, emergency equipment available, intubation equipment available, IV patency confirmed, oxygen available and suction available   Procedure details (see MAR for exact dosages):    Preoxygenation:  Nasal cannula   Sedation:  Propofol   Intra-procedure monitoring:  Blood pressure monitoring, cardiac monitor, continuous capnometry, continuous pulse oximetry, frequent LOC assessments and frequent vital sign checks   Total Provider sedation time (minutes):  10 Post-procedure details:    Post-sedation assessment completed:   01/11/2018 11:09 AM   Attendance: Constant attendance by certified staff until patient recovered     Recovery: Patient returned to pre-procedure baseline     Post-sedation assessments completed and reviewed: airway patency, cardiovascular function, hydration status, mental status, nausea/vomiting, pain level, respiratory function and temperature     Patient is stable for discharge or admission: yes     Patient tolerance:  Tolerated well, no immediate complications    Critical Care performed:   ____________________________________________   INITIAL IMPRESSION / ASSESSMENT AND PLAN / ED COURSE  Pertinent labs & imaging results that were available during my care of the patient were reviewed by me and considered in my medical decision making (see chart for details).  DDX: Shoulder fracture, shoulder dislocation, shoulder pain, shoulder contusion, humerus fracture As part of my medical decision making, I reviewed the following data within the electronic MEDICAL RECORD NUMBER Old chart reviewed  ----------------------------------------- 11:10 AM on 01/11/2018 -----------------------------------------  Post procedure the patient was placed in a right sided shoulder immobilizer.  The patient is sensate  over the right deltoid and is neurovascularly intact to the right upper extremity throughout.  The patient, without pain at this time.  She will follow-up with orthopedics.  She knows to keep the right upper extremity knee immobilizer.  She is understanding of the treatment plan and willing to comply but will be discharged at this time.      ____________________________________________   FINAL CLINICAL IMPRESSION(S) / ED DIAGNOSES  Right shoulder dislocation.    NEW MEDICATIONS STARTED DURING THIS VISIT:  New Prescriptions   No medications on file     Note:  This document was prepared using Dragon voice recognition software and may include unintentional dictation errors.      Myrna BlazerSchaevitz, Dontrail Blackwell Matthew, MD 01/11/18 [phone]

## 2018-01-16 ENCOUNTER — Other Ambulatory Visit: Payer: Self-pay | Admitting: Sports Medicine

## 2018-01-16 DIAGNOSIS — S43014A Anterior dislocation of right humerus, initial encounter: Principal | ICD-10-CM

## 2018-01-16 DIAGNOSIS — S41001A Unspecified open wound of right shoulder, initial encounter: Secondary | ICD-10-CM

## 2018-01-19 ENCOUNTER — Other Ambulatory Visit: Payer: Self-pay | Admitting: Sports Medicine

## 2018-01-19 DIAGNOSIS — S41001A Unspecified open wound of right shoulder, initial encounter: Secondary | ICD-10-CM

## 2018-01-19 DIAGNOSIS — S43014A Anterior dislocation of right humerus, initial encounter: Principal | ICD-10-CM

## 2018-01-25 ENCOUNTER — Ambulatory Visit: Admission: RE | Admit: 2018-01-25 | Payer: Managed Care, Other (non HMO) | Source: Ambulatory Visit

## 2018-01-31 ENCOUNTER — Ambulatory Visit
Admission: RE | Admit: 2018-01-31 | Discharge: 2018-01-31 | Disposition: A | Discharge status: Home / Self Care | Payer: Managed Care, Other (non HMO) | Source: Ambulatory Visit | Attending: Sports Medicine | Admitting: Sports Medicine

## 2018-01-31 DIAGNOSIS — S41001A Unspecified open wound of right shoulder, initial encounter: Secondary | ICD-10-CM

## 2018-01-31 DIAGNOSIS — M24411 Recurrent dislocation, right shoulder: Secondary | ICD-10-CM | POA: Insufficient documentation

## 2018-01-31 DIAGNOSIS — S43014A Anterior dislocation of right humerus, initial encounter: Secondary | ICD-10-CM | POA: Diagnosis present

## 2018-01-31 DIAGNOSIS — X58XXXA Exposure to other specified factors, initial encounter: Secondary | ICD-10-CM | POA: Diagnosis not present

## 2018-01-31 MED ORDER — LIDOCAINE HCL (PF) 1 % IJ SOLN
5.0000 mL | Freq: Once | INTRAMUSCULAR | Status: AC
  Administered 2018-01-31: 5 mL
  Filled 2018-01-31: qty 5

## 2018-01-31 MED ORDER — IOPAMIDOL (ISOVUE-200) INJECTION 41%
50.0000 mL | Freq: Once | INTRAVENOUS | Status: AC | PRN
  Administered 2018-01-31: 10 mL via INTRAVENOUS
  Filled 2018-01-31: qty 50

## 2018-01-31 MED ORDER — GADOBENATE DIMEGLUMINE 529 MG/ML IV SOLN
5.0000 mL | Freq: Once | INTRAVENOUS | Status: AC | PRN
  Administered 2018-01-31: 5 mL via INTRAVENOUS

## 2018-07-26 ENCOUNTER — Emergency Department: Payer: Managed Care, Other (non HMO)

## 2018-07-26 ENCOUNTER — Other Ambulatory Visit: Payer: Self-pay

## 2018-07-26 ENCOUNTER — Emergency Department
Admission: EM | Admit: 2018-07-26 | Discharge: 2018-07-26 | Disposition: A | Discharge status: Home / Self Care | Payer: Managed Care, Other (non HMO) | Attending: Emergency Medicine | Admitting: Emergency Medicine

## 2018-07-26 DIAGNOSIS — Z87891 Personal history of nicotine dependence: Secondary | ICD-10-CM | POA: Insufficient documentation

## 2018-07-26 DIAGNOSIS — T148XXA Other injury of unspecified body region, initial encounter: Secondary | ICD-10-CM

## 2018-07-26 DIAGNOSIS — S43014A Anterior dislocation of right humerus, initial encounter: Secondary | ICD-10-CM | POA: Diagnosis not present

## 2018-07-26 DIAGNOSIS — Y9389 Activity, other specified: Secondary | ICD-10-CM | POA: Diagnosis not present

## 2018-07-26 DIAGNOSIS — S43004A Unspecified dislocation of right shoulder joint, initial encounter: Secondary | ICD-10-CM

## 2018-07-26 DIAGNOSIS — S4991XA Unspecified injury of right shoulder and upper arm, initial encounter: Secondary | ICD-10-CM | POA: Diagnosis present

## 2018-07-26 DIAGNOSIS — X500XXA Overexertion from strenuous movement or load, initial encounter: Secondary | ICD-10-CM | POA: Diagnosis not present

## 2018-07-26 DIAGNOSIS — Y999 Unspecified external cause status: Secondary | ICD-10-CM | POA: Diagnosis not present

## 2018-07-26 DIAGNOSIS — Y929 Unspecified place or not applicable: Secondary | ICD-10-CM | POA: Diagnosis not present

## 2018-07-26 MED ORDER — PROPOFOL 10 MG/ML IV BOLUS
INTRAVENOUS | Status: AC | PRN
  Administered 2018-07-26: 45.4 mg via INTRAVENOUS

## 2018-07-26 MED ORDER — PROPOFOL 10 MG/ML IV BOLUS
0.5000 mg/kg | Freq: Once | INTRAVENOUS | Status: DC
  Filled 2018-07-26: qty 20

## 2018-07-26 MED ORDER — HYDROMORPHONE HCL 1 MG/ML IJ SOLN
0.5000 mg | Freq: Once | INTRAMUSCULAR | Status: AC
  Administered 2018-07-26: 0.5 mg via INTRAVENOUS
  Filled 2018-07-26: qty 1

## 2018-07-26 MED ORDER — DIAZEPAM 5 MG PO TABS
10.0000 mg | ORAL_TABLET | Freq: Once | ORAL | Status: AC
  Administered 2018-07-26: 10 mg via ORAL
  Filled 2018-07-26: qty 2

## 2018-07-26 MED ORDER — OXYCODONE-ACETAMINOPHEN 5-325 MG PO TABS: 1.0000 | ORAL_TABLET | Freq: Three times a day (TID) | ORAL | 0 refills | Status: DC | PRN

## 2018-07-26 NOTE — Sedation Documentation (Addendum)
X-ray at bedside for repeat X-ray 

## 2018-07-26 NOTE — Sedation Documentation (Signed)
Pt now intermittently asking "so when's he going to do it", this RN explained MD already performed procedure, pt states "okay" then closes her eyes again.

## 2018-07-26 NOTE — ED Notes (Signed)
First Nurse Note: Patient states "my shoulder is dislocated".  Holding right shoulder.  States this has happened before.  Alert and oriented.  Color good.

## 2018-07-26 NOTE — ED Triage Notes (Signed)
Pt states that she has hx of dislocating her right shoulder and this morning she went to get out of bed and dislocated the right shoulder again - denies any injury

## 2018-07-26 NOTE — ED Provider Notes (Signed)
[facility] Emergency Department Provider Note       Time seen: ----------------------------------------- 8:05 AM on 07/26/2018 -----------------------------------------   I have reviewed the triage vital signs and the nursing notes.  HISTORY   Chief Complaint Shoulder Pain    HPI Carmen Singleton is a 57 y.o. female with a history of cardiac arrest, PE, CVA, recurrent right shoulder dislocations who presents to the ED for right shoulder dislocation.  Patient states she has a history of this, this morning she went to get out of bed and dislocated the right shoulder but denies any injury.  She does have some numbness over the right shoulder.  She denies any other injuries or complaints.  Past Medical History:  Diagnosis Date  . H/O cardiac arrest   . Microscopic colitis   . 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 Pulmonary embolism (HCC)    2014 pt went into cardiac arrest r/t pulmonary emboli  . Rosacea   . Stroke (cerebrum) [facility])     Patient Active Problem List   Diagnosis Date Noted  . Pulmonary embolism (HCC)   . Stroke (cerebrum) (HCC)   . Microscopic colitis   . Rosacea     Past Surgical History:  Procedure Laterality Date  . CANNULATION FOR CARDIOPULMONARY BYPASS     ECMO  . COLONOSCOPY  09/16/2016   tubular adenoma, microscopic colitis  . COLONOSCOPY WITH ESOPHAGOGASTRODUODENOSCOPY (EGD)  2012  . IVC FILTER INSERTION  2014  . WRIST SURGERY  left    Allergies Patient has no known allergies.  Social History Social History   Tobacco Use  . Smoking status: Former Games developer  . Smokeless tobacco: Never Used  Substance Use Topics  . Alcohol use: Yes    Alcohol/week: 2.0 standard drinks    Types: 2 Glasses of wine per week  . Drug use: No   Review of Systems Constitutional: Negative for fever. Cardiovascular: Negative for chest pain. Respiratory: Negative for shortness of breath. Gastrointestinal: Negative for abdominal pain,  vomiting and diarrhea. Musculoskeletal: Positive for right shoulder pain and deformity Skin: Negative for rash. Neurological: Negative for headaches, positive for numbness over the right shoulder  All systems negative/normal/unremarkable except as stated in the HPI  ____________________________________________   PHYSICAL EXAM:  VITAL SIGNS: ED Triage Vitals  Enc Vitals Group     BP 07/26/18 0745 125/83     Pulse Rate 07/26/18 0745 86     Resp 07/26/18 0745 16     Temp 07/26/18 0745 98 F (36.7 C)     Temp Source 07/26/18 0745 Oral     SpO2 07/26/18 0745 96 %     Weight 07/26/18 0743 200 lb (90.7 kg)     Height 07/26/18 0743 5\' 6"  (1.676 m)     Head Circumference --      Peak Flow --      Pain Score 07/26/18 0743 10     Pain Loc --      Pain Edu? --      Excl. in GC? --    Constitutional: Alert and oriented. Well appearing and in no distress. Eyes: Conjunctivae are normal. Normal extraocular movements. ENT   Head: Normocephalic and atraumatic.   Nose: No congestion/rhinnorhea.   Mouth/Throat: Mucous membranes are moist.   Neck: No stridor. Cardiovascular: Normal rate, regular rhythm. No murmurs, rubs, or gallops. Respiratory: Normal respiratory effort without tachypnea nor retractions. Breath sounds are clear and equal bilaterally. No wheezes/rales/rhonchi. Gastrointestinal: Soft and nontender.  Normal bowel sounds Musculoskeletal: Right shoulder deformity is noted consistent with dislocation. Neurologic:  Normal speech and language. No gross focal neurologic deficits are appreciated.  Skin:  Skin is warm, dry and intact. No rash noted. Psychiatric: Mood and affect are normal. Speech and behavior are normal.  ____________________________________________  ED COURSE:  As part of my medical decision making, I reviewed the following data within the electronic MEDICAL RECORD NUMBER History obtained from family if available, nursing notes, old chart and ekg, as well as  notes from prior ED visits. Patient presented for right shoulder dislocation, we will assess with imaging as indicated at this time.  Patient states the shoulder cannot be reduced except with procedural sedation.   .Sedation Date/Time: 07/26/2018 9:34 AM Performed by: Emily Filbert, MD Authorized by: Emily Filbert, MD   Consent:    Consent obtained:  Written (electronic informed consent)   Risks discussed:  Inadequate sedation and respiratory compromise necessitating ventilatory assistance and intubation Universal protocol:    Procedure explained and questions answered to patient or proxy's satisfaction: yes     Relevant documents present and verified: yes     Test results available and properly labeled: yes     Imaging studies available: yes     Required blood products, implants, devices, and special equipment available: yes     Immediately prior to procedure a time out was called: yes     Patient identity confirmation method:  Arm band Indications:    Procedure performed:  Dislocation reduction   Intended level of sedation:  Deep Pre-sedation assessment:    Time since last food or drink:  This am   ASA classification: class 2 - patient with mild systemic disease     Neck mobility: normal     Mouth opening:  3 or more finger widths   Mallampati score:  II - soft palate, uvula, fauces visible   Pre-sedation assessments completed and reviewed: airway patency, cardiovascular function, mental status, nausea/vomiting, respiratory function and temperature   Immediate pre-procedure details:    Reassessment: Patient reassessed immediately prior to procedure     Reviewed: vital signs, relevant labs/tests and NPO status     Verified: bag valve mask available, emergency equipment available, intubation equipment available, IV patency confirmed, oxygen available, reversal medications available and suction available   Procedure details (see MAR for exact dosages):    Preoxygenation:   Nasal cannula   Sedation:  Propofol   Intra-procedure monitoring:  Blood pressure monitoring, continuous pulse oximetry, cardiac monitor, frequent vital sign checks and frequent LOC assessments   Intra-procedure events: none     Total Provider sedation time (minutes):  10 Post-procedure details:    Attendance: Constant attendance by certified staff until patient recovered     Recovery: Patient returned to pre-procedure baseline     Post-sedation assessments completed and reviewed: airway patency, cardiovascular function, hydration status, mental status and respiratory function     Patient is stable for discharge or admission: yes     Patient tolerance:  Tolerated well, no immediate complications   Reduction of dislocation Date/Time: 9:35 AM Performed by: Ulice Dash Authorized by: Ulice Dash Consent: Verbal consent obtained. Risks and benefits: risks, benefits and alternatives were discussed Consent given by: patient Required items: required blood products, implants, devices, and special equipment available Time out: Immediately prior to procedure a "time out" was called to verify the correct patient, procedure, equipment, support staff and site/side marked as required.  Patient sedated: With propofol  Vitals: Vital signs were monitored during sedation. Patient tolerance: Patient tolerated the procedure well with no immediate complications. Joint: Right shoulder Reduction technique: Traction/countertraction   RADIOLOGY Images were viewed by me  Right shoulder x-ray reveals anterior dislocation  Postreduction film reveals satisfactory reduction with no obvious fracture ____________________________________________  DIFFERENTIAL DIAGNOSIS   Right shoulder dislocation, fracture, contusion, strain  FINAL ASSESSMENT AND PLAN  Right shoulder dislocation status post reduction, deep sedation   Plan: The patient had presented for right shoulder pain and  anterior dislocation.  Patient's imaging revealed anterior dislocation and postreduction film revealed satisfactory reduction.  She was placed in a shoulder immobilizer and tolerated sedation well.  She will be referred to orthopedics and will definitely need surgical fixation as this is the fifth dislocation she has had.   Ulice Dash, MD   Note: This note was generated in part or whole with voice recognition software. Voice recognition is usually quite accurate but there are transcription errors that can and very often do occur. I apologize for any typographical errors that were not detected and corrected.     Emily Filbert, MD 07/26/18 [phone]

## 2018-08-03 ENCOUNTER — Other Ambulatory Visit: Payer: Self-pay | Admitting: Orthopedic Surgery

## 2018-08-03 DIAGNOSIS — M24411 Recurrent dislocation, right shoulder: Secondary | ICD-10-CM

## 2018-08-14 ENCOUNTER — Encounter
Admission: RE | Admit: 2018-08-14 | Discharge: 2018-08-14 | Disposition: A | Discharge status: Home / Self Care | Payer: Managed Care, Other (non HMO) | Source: Ambulatory Visit | Attending: Orthopedic Surgery | Admitting: Orthopedic Surgery

## 2018-08-14 ENCOUNTER — Other Ambulatory Visit: Payer: Self-pay

## 2018-08-14 DIAGNOSIS — R9431 Abnormal electrocardiogram [ECG] [EKG]: Secondary | ICD-10-CM | POA: Insufficient documentation

## 2018-08-14 DIAGNOSIS — Z01818 Encounter for other preprocedural examination: Secondary | ICD-10-CM | POA: Insufficient documentation

## 2018-08-14 DIAGNOSIS — I252 Old myocardial infarction: Secondary | ICD-10-CM | POA: Diagnosis not present

## 2018-08-14 DIAGNOSIS — M24411 Recurrent dislocation, right shoulder: Secondary | ICD-10-CM | POA: Diagnosis present

## 2018-08-14 HISTORY — DX: Gastro-esophageal reflux disease without esophagitis: K21.9

## 2018-08-14 NOTE — Pre-Procedure Instructions (Signed)
Clearance form faxed to Dr. Tiana Loft at [facility] at [facility] requesting recommendation for stop date of daily Xarelto perioperatively.  Pt encouraged to contact PCP office directly if she has not heard back today.

## 2018-08-14 NOTE — Patient Instructions (Signed)
  Your procedure is scheduled on: Monday August 21, 2018 Report to Same Day Surgery 2nd floor medical mall [facility] Entrance-take elevator on left to 2nd floor.  Check in with surgery information desk.) To find out your arrival time please call [phone] between 1PM - 3PM on Friday August 18, 2018  Remember: Instructions that are not followed completely may result in serious medical risk, up to and including death, or upon the discretion of your surgeon and anesthesiologist your surgery may need to be rescheduled.    _x___ 1. Do not eat food (including mints, candies, chewing gum) after midnight the night before your procedure. You may drink clear liquids up to 2 hours before you are scheduled to arrive at the hospital for your procedure.  Do not drink clear liquids within 2 hours of your scheduled arrival to the hospital.  Clear liquids include  --Water or Apple juice without pulp  --Clear carbohydrate beverage such as Gatorade  --Black Coffee or Clear Tea (No milk, no creamers, do not add anything to the coffee or tea)    __x__ 2. No Alcohol for 24 hours before or after surgery.   __x__ 3. No Smoking or e-cigarettes for 24 prior to surgery.  Do not use any chewable tobacco products for at least 6 hour prior to surgery   __x__ 4. Notify your doctor if there is any change in your medical condition (cold, fever, infections).   __x__ 5. On the morning of surgery brush your teeth with toothpaste and water.  You may rinse your mouth with mouth wash if you wish.  Do not swallow any toothpaste or mouthwash.  Please read over the following fact sheets that you were given:   [facility] Preparing for Surgery and or MRSA Information    __x__ Use CHG Soap or sage wipes as directed on instruction sheet    Do not wear jewelry, make-up, hairpins, clips or nail polish.  Do not wear lotions, powders, deodorant, or perfumes.   Do not shave below the face/neck 48 hours prior to surgery.   Do  not bring valuables to the hospital.    [facility] is not responsible for any belongings or valuables.               Contacts, dentures or bridgework may not be worn into surgery.  For patients discharged on the day of surgery, you will NOT be permitted to drive yourself home.   _x___ Take anti-hypertensive listed below, cardiac, seizure, asthma, anti-reflux and psychiatric medicines. These include:  1. Pantoprazole/Protonix  _x___ Follow recommendations from Cardiologist, Pulmonologist or PCP regarding stopping Xarelto, Aspirin, Coumadin, Plavix ,Eliquis, Effient, or Pradaxa, and Pletal.  _x___ Stop Anti-inflammatories such as Advil, Aleve, Ibuprofen, Motrin, Naproxen, Naprosyn, Goodies powders or aspirin products. OK to take Tylenol and Celebrex.   _x___ NOW: Stop supplements until after surgery.  But may continue Vitamin D, Vitamin B, and multivitamin.

## 2018-08-16 ENCOUNTER — Ambulatory Visit
Admission: RE | Admit: 2018-08-16 | Discharge: 2018-08-16 | Disposition: A | Discharge status: Home / Self Care | Payer: Managed Care, Other (non HMO) | Source: Ambulatory Visit | Attending: Orthopedic Surgery | Admitting: Orthopedic Surgery

## 2018-08-16 DIAGNOSIS — M24411 Recurrent dislocation, right shoulder: Secondary | ICD-10-CM | POA: Diagnosis not present

## 2018-08-16 MED ORDER — SODIUM CHLORIDE 0.9 % IJ SOLN
20.0000 mL | INTRAMUSCULAR | Status: AC | PRN
  Administered 2018-08-16: 20 mL

## 2018-08-16 MED ORDER — LIDOCAINE HCL (PF) 1 % IJ SOLN
10.0000 mL | Freq: Once | INTRAMUSCULAR | Status: AC
  Administered 2018-08-16: 10 mL
  Filled 2018-08-16: qty 10

## 2018-08-16 MED ORDER — IOPAMIDOL (ISOVUE-200) INJECTION 41%
50.0000 mL | Freq: Once | INTRAVENOUS | Status: AC | PRN
  Administered 2018-08-16: 20 mL
  Filled 2018-08-16: qty 50

## 2018-08-16 MED ORDER — GADOBENATE DIMEGLUMINE 529 MG/ML IV SOLN
5.0000 mL | Freq: Once | INTRAVENOUS | Status: AC | PRN
  Administered 2018-08-16: 0.1 mL via INTRA_ARTICULAR

## 2018-08-17 NOTE — Pre-Procedure Instructions (Addendum)
TELEPHONE NOTE ON CHART ADDRESSING CLEARANCE REGARDING XARELTO ONLY. SPOKE WITH DENET AT DR CABELLON. NEED CLEARANCE/EKG ADDRESSES TO COMPLETE CLEARANCE

## 2018-08-17 NOTE — Pre-Procedure Instructions (Signed)
Spoke to Danette at [facility] PCP [place], patient will need cardiology clearance based on EKG. They will call patient to see who she would prefer to see then notify PAT with info.

## 2018-08-19 NOTE — Anesthesia Preprocedure Evaluation (Addendum)
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, on chronic anticoagulation with Xarelto   Neuro/Psych CVA negative neurological ROS  negative psych ROS   GI/Hepatic negative GI ROS, Neg liver ROS, GERD  ,  Endo/Other  negative endocrine ROS  Renal/GU      Musculoskeletal   Abdominal   Peds  Hematology negative hematology ROS (+)   Anesthesia Other Findings Past Medical History: No date: GERD (gastroesophageal reflux disease) No date: H/O cardiac arrest No date: Microscopic colitis No date: Neuropathy     Comment:  left leg from ECMO No date: Pulmonary embolism (HCC)     Comment:  2014 pt went into cardiac arrest r/t pulmonary emboli No date: Rosacea No date: Stroke (cerebrum) [facility])  Past Surgical History: No date: CANNULATION FOR CARDIOPULMONARY BYPASS     Comment:  ECMO 09/16/2016: COLONOSCOPY     Comment:  tubular adenoma, microscopic colitis 2012: COLONOSCOPY WITH ESOPHAGOGASTRODUODENOSCOPY (EGD) 2014: IVC FILTER INSERTION left: WRIST SURGERY     Reproductive/Obstetrics negative OB ROS                            Anesthesia Physical Anesthesia Plan  ASA: III  Anesthesia Plan: General ETT and Regional   Post-op Pain Management:    Induction:   PONV Risk Score and Plan: Ondansetron, Dexamethasone and Midazolam  Airway Management Planned:   Additional Equipment:   Intra-op Plan:   Post-operative Plan:   Informed Consent: I have reviewed the patients History and Physical, chart, labs and discussed the procedure including the risks, benefits and alternatives for the  proposed anesthesia with the patient or authorized representative who has indicated his/her understanding and acceptance.   Dental Advisory Given  Plan Discussed with: Anesthesiologist, CRNA and Surgeon  Anesthesia Plan Comments: (Plan interscalene nerve block if Xarelto held >48 hrs)        Anesthesia Quick Evaluation

## 2018-08-20 MED ORDER — CEFAZOLIN SODIUM-DEXTROSE 2-4 GM/100ML-% IV SOLN
2.0000 g | Freq: Once | INTRAVENOUS | Status: AC
  Administered 2018-08-21: 2 g via INTRAVENOUS

## 2018-08-21 ENCOUNTER — Other Ambulatory Visit: Payer: Self-pay

## 2018-08-21 ENCOUNTER — Ambulatory Visit: Payer: Managed Care, Other (non HMO) | Admitting: Anesthesiology

## 2018-08-21 ENCOUNTER — Encounter
Admission: RE | Disposition: A | Discharge status: Home / Self Care | Payer: Self-pay | Source: Ambulatory Visit | Attending: Orthopedic Surgery

## 2018-08-21 ENCOUNTER — Ambulatory Visit
Admission: RE | Admit: 2018-08-21 | Discharge: 2018-08-21 | Disposition: A | Discharge status: Home / Self Care | Payer: Managed Care, Other (non HMO) | Source: Ambulatory Visit | Attending: Orthopedic Surgery | Admitting: Orthopedic Surgery

## 2018-08-21 DIAGNOSIS — Y939 Activity, unspecified: Secondary | ICD-10-CM | POA: Diagnosis not present

## 2018-08-21 DIAGNOSIS — S42291A Other displaced fracture of upper end of right humerus, initial encounter for closed fracture: Secondary | ICD-10-CM | POA: Insufficient documentation

## 2018-08-21 DIAGNOSIS — S43084A Other dislocation of right shoulder joint, initial encounter: Secondary | ICD-10-CM | POA: Insufficient documentation

## 2018-08-21 DIAGNOSIS — Z86711 Personal history of pulmonary embolism: Secondary | ICD-10-CM | POA: Diagnosis not present

## 2018-08-21 DIAGNOSIS — Z7901 Long term (current) use of anticoagulants: Secondary | ICD-10-CM | POA: Diagnosis not present

## 2018-08-21 DIAGNOSIS — Z8673 Personal history of transient ischemic attack (TIA), and cerebral infarction without residual deficits: Secondary | ICD-10-CM | POA: Diagnosis not present

## 2018-08-21 DIAGNOSIS — Z86718 Personal history of other venous thrombosis and embolism: Secondary | ICD-10-CM | POA: Diagnosis not present

## 2018-08-21 DIAGNOSIS — X58XXXA Exposure to other specified factors, initial encounter: Secondary | ICD-10-CM | POA: Diagnosis not present

## 2018-08-21 DIAGNOSIS — Z87891 Personal history of nicotine dependence: Secondary | ICD-10-CM | POA: Insufficient documentation

## 2018-08-21 DIAGNOSIS — Z79899 Other long term (current) drug therapy: Secondary | ICD-10-CM | POA: Insufficient documentation

## 2018-08-21 DIAGNOSIS — K219 Gastro-esophageal reflux disease without esophagitis: Secondary | ICD-10-CM | POA: Diagnosis not present

## 2018-08-21 DIAGNOSIS — Y92003 Bedroom of unspecified non-institutional (private) residence as the place of occurrence of the external cause: Secondary | ICD-10-CM | POA: Diagnosis not present

## 2018-08-21 DIAGNOSIS — M65811 Other synovitis and tenosynovitis, right shoulder: Secondary | ICD-10-CM | POA: Diagnosis not present

## 2018-08-21 DIAGNOSIS — Z8674 Personal history of sudden cardiac arrest: Secondary | ICD-10-CM | POA: Insufficient documentation

## 2018-08-21 DIAGNOSIS — M25311 Other instability, right shoulder: Secondary | ICD-10-CM | POA: Diagnosis not present

## 2018-08-21 HISTORY — PX: SHOULDER ARTHROSCOPY WITH LABRAL REPAIR: SHX5691

## 2018-08-21 SURGERY — ARTHROSCOPY, SHOULDER, WITH GLENOID LABRUM REPAIR
Anesthesia: Regional | Site: Shoulder | Laterality: Right

## 2018-08-21 MED ORDER — DEXAMETHASONE SODIUM PHOSPHATE 10 MG/ML IJ SOLN
INTRAMUSCULAR | Status: DC | PRN
  Administered 2018-08-21: 6 mg via INTRAVENOUS

## 2018-08-21 MED ORDER — FENTANYL CITRATE (PF) 100 MCG/2ML IJ SOLN
50.0000 ug | Freq: Once | INTRAMUSCULAR | Status: AC
  Administered 2018-08-21: 50 ug via INTRAVENOUS

## 2018-08-21 MED ORDER — OXYCODONE HCL 5 MG/5ML PO SOLN: 5.0000 mg | Freq: Once | ORAL | Status: DC | PRN

## 2018-08-21 MED ORDER — MIDAZOLAM HCL 2 MG/2ML IJ SOLN
1.0000 mg | Freq: Once | INTRAMUSCULAR | Status: AC
  Administered 2018-08-21: 1 mg via INTRAVENOUS

## 2018-08-21 MED ORDER — ROCURONIUM BROMIDE 100 MG/10ML IV SOLN
INTRAVENOUS | Status: DC | PRN
  Administered 2018-08-21: 50 mg via INTRAVENOUS

## 2018-08-21 MED ORDER — FENTANYL CITRATE (PF) 100 MCG/2ML IJ SOLN: 25.0000 ug | INTRAMUSCULAR | Status: DC | PRN

## 2018-08-21 MED ORDER — FENTANYL CITRATE (PF) 100 MCG/2ML IJ SOLN
INTRAMUSCULAR | Status: AC
  Administered 2018-08-21: 50 ug via INTRAVENOUS
  Filled 2018-08-21: qty 2

## 2018-08-21 MED ORDER — ASPIRIN EC 325 MG PO TBEC: 325.0000 mg | DELAYED_RELEASE_TABLET | Freq: Every day | ORAL | 0 refills | Status: DC

## 2018-08-21 MED ORDER — LIDOCAINE HCL (PF) 1 % IJ SOLN
INTRAMUSCULAR | Status: AC
  Filled 2018-08-21: qty 5

## 2018-08-21 MED ORDER — PROPOFOL 10 MG/ML IV BOLUS
INTRAVENOUS | Status: AC
  Filled 2018-08-21: qty 20

## 2018-08-21 MED ORDER — SODIUM CHLORIDE 0.9 % IV SOLN
INTRAVENOUS | Status: DC | PRN
  Administered 2018-08-21: 10 ug/min via INTRAVENOUS

## 2018-08-21 MED ORDER — OXYCODONE HCL 5 MG PO TABS: 5.0000 mg | ORAL_TABLET | ORAL | 0 refills | Status: DC | PRN

## 2018-08-21 MED ORDER — FENTANYL CITRATE (PF) 100 MCG/2ML IJ SOLN
INTRAMUSCULAR | Status: DC | PRN
  Administered 2018-08-21: 100 ug via INTRAVENOUS
  Administered 2018-08-21: 50 ug via INTRAVENOUS

## 2018-08-21 MED ORDER — OXYCODONE HCL 5 MG PO TABS: 5.0000 mg | ORAL_TABLET | Freq: Once | ORAL | Status: DC | PRN

## 2018-08-21 MED ORDER — EPHEDRINE SULFATE 50 MG/ML IJ SOLN
INTRAMUSCULAR | Status: DC | PRN
  Administered 2018-08-21 (×2): 10 mg via INTRAVENOUS

## 2018-08-21 MED ORDER — PROPOFOL 10 MG/ML IV BOLUS
INTRAVENOUS | Status: DC | PRN
  Administered 2018-08-21: 150 mg via INTRAVENOUS

## 2018-08-21 MED ORDER — PROMETHAZINE HCL 25 MG/ML IJ SOLN: 6.2500 mg | INTRAMUSCULAR | Status: DC | PRN

## 2018-08-21 MED ORDER — ONDANSETRON HCL 4 MG/2ML IJ SOLN
INTRAMUSCULAR | Status: DC | PRN
  Administered 2018-08-21: 4 mg via INTRAVENOUS

## 2018-08-21 MED ORDER — FENTANYL CITRATE (PF) 100 MCG/2ML IJ SOLN
INTRAMUSCULAR | Status: AC
  Filled 2018-08-21: qty 2

## 2018-08-21 MED ORDER — BUPIVACAINE LIPOSOME 1.3 % IJ SUSP
INTRAMUSCULAR | Status: AC
  Filled 2018-08-21: qty 20

## 2018-08-21 MED ORDER — SUGAMMADEX SODIUM 200 MG/2ML IV SOLN
INTRAVENOUS | Status: DC | PRN
  Administered 2018-08-21: 200 mg via INTRAVENOUS

## 2018-08-21 MED ORDER — BUPIVACAINE HCL (PF) 0.5 % IJ SOLN
INTRAMUSCULAR | Status: AC
  Filled 2018-08-21: qty 10

## 2018-08-21 MED ORDER — ONDANSETRON 4 MG PO TBDP: 4.0000 mg | ORAL_TABLET | Freq: Three times a day (TID) | ORAL | 0 refills | Status: DC | PRN

## 2018-08-21 MED ORDER — ACETAMINOPHEN 500 MG PO TABS: 1000.0000 mg | ORAL_TABLET | Freq: Three times a day (TID) | ORAL | 2 refills | Status: DC

## 2018-08-21 MED ORDER — CEFAZOLIN SODIUM-DEXTROSE 2-4 GM/100ML-% IV SOLN
INTRAVENOUS | Status: AC
  Filled 2018-08-21: qty 100

## 2018-08-21 MED ORDER — LACTATED RINGERS IV SOLN
INTRAVENOUS | Status: DC
  Administered 2018-08-21: 1000 mL via INTRAVENOUS

## 2018-08-21 MED ORDER — LACTATED RINGERS IV SOLN
INTRAVENOUS | Status: DC | PRN
  Administered 2018-08-21: 10 mL

## 2018-08-21 MED ORDER — MIDAZOLAM HCL 2 MG/2ML IJ SOLN
INTRAMUSCULAR | Status: AC
  Administered 2018-08-21: 1 mg via INTRAVENOUS
  Filled 2018-08-21: qty 2

## 2018-08-21 MED ORDER — LIDOCAINE HCL (CARDIAC) PF 100 MG/5ML IV SOSY
PREFILLED_SYRINGE | INTRAVENOUS | Status: DC | PRN
  Administered 2018-08-21: 100 mg via INTRAVENOUS

## 2018-08-21 SURGICAL SUPPLY — 66 items
ADAPTER IRRIG TUBE 2 SPIKE SOL (ADAPTER) ×6 IMPLANT
ANCHOR SUT BIOCOMP LK 2.9X12.5 (Anchor) ×9 IMPLANT
BRUSH SCRUB EZ  4% CHG (MISCELLANEOUS) ×2
BRUSH SCRUB EZ 4% CHG (MISCELLANEOUS) ×1 IMPLANT
BUR RADIUS 4.0X18.5 (BURR) ×3 IMPLANT
BUR RADIUS 5.5 (BURR) ×3 IMPLANT
CANNULA 5.75X7CM (CANNULA)
CANNULA PART THRD DISP 5.75X7 (CANNULA) IMPLANT
CANNULA PARTIAL THREAD 2X7 (CANNULA) ×6 IMPLANT
CANNULA TWIST IN 8.25X9CM (CANNULA) IMPLANT
CHLORAPREP W/TINT 26ML (MISCELLANEOUS) ×3 IMPLANT
CLOSURE WOUND 1/2 X4 (GAUZE/BANDAGES/DRESSINGS) ×1
COOLER POLAR GLACIER W/PUMP (MISCELLANEOUS) ×3 IMPLANT
CRADLE LAMINECT ARM (MISCELLANEOUS) ×3 IMPLANT
DRAPE IMP U-DRAPE 54X76 (DRAPES) ×6 IMPLANT
DRAPE INCISE IOBAN 66X45 STRL (DRAPES) ×3 IMPLANT
DRAPE SHEET LG 3/4 BI-LAMINATE (DRAPES) ×3 IMPLANT
DRSG TEGADERM 4X4.75 (GAUZE/BANDAGES/DRESSINGS) ×6 IMPLANT
ELECT REM PT RETURN 9FT ADLT (ELECTROSURGICAL) ×3
ELECTRODE REM PT RTRN 9FT ADLT (ELECTROSURGICAL) ×1 IMPLANT
GAUZE 4X4 16PLY RFD (DISPOSABLE) ×3 IMPLANT
GAUZE PETRO XEROFOAM 1X8 (MISCELLANEOUS) ×3 IMPLANT
GLOVE BIOGEL PI IND STRL 8 (GLOVE) ×1 IMPLANT
GLOVE BIOGEL PI INDICATOR 8 (GLOVE) ×2
GLOVE SURG SYN 7.5  E (GLOVE) ×2
GLOVE SURG SYN 7.5 E (GLOVE) ×1 IMPLANT
GOWN STRL REUS W/ TWL LRG LVL3 (GOWN DISPOSABLE) ×1 IMPLANT
GOWN STRL REUS W/TWL LRG LVL3 (GOWN DISPOSABLE) ×2
GOWN STRL REUS W/TWL LRG LVL4 (GOWN DISPOSABLE) ×3 IMPLANT
IV LACTATED RINGER IRRG 3000ML (IV SOLUTION) ×8
IV LR IRRIG 3000ML ARTHROMATIC (IV SOLUTION) ×4 IMPLANT
KIT INSERTION 2.9 PUSHLOCK (KITS) ×3 IMPLANT
KIT SUTURETAK 3.0 INSERT PERC (KITS) ×3 IMPLANT
KIT TURNOVER KIT A (KITS) ×3 IMPLANT
LASSO 25 DEG QUICKPASS CVD LT (SUTURE) ×3 IMPLANT
LASSO 25 DEG RIGHT QUICKPASS (SUTURE) ×3 IMPLANT
MANIFOLD NEPTUNE II (INSTRUMENTS) ×3 IMPLANT
MAT ABSORB  FLUID 56X50 GRAY (MISCELLANEOUS) ×4
MAT ABSORB FLUID 56X50 GRAY (MISCELLANEOUS) ×2 IMPLANT
NDL SAFETY ECLIPSE 18X1.5 (NEEDLE) ×1 IMPLANT
NEEDLE HYPO 18GX1.5 SHARP (NEEDLE) ×2
NEEDLE HYPO 22GX1.5 SAFETY (NEEDLE) ×3 IMPLANT
PACK ARTHROSCOPY SHOULDER (MISCELLANEOUS) ×3 IMPLANT
PAD ABD DERMACEA PRESS 5X9 (GAUZE/BANDAGES/DRESSINGS) ×3 IMPLANT
PAD WRAPON POLAR SHDR XLG (MISCELLANEOUS) ×1 IMPLANT
SET TUBE SUCT SHAVER OUTFL 24K (TUBING) ×3 IMPLANT
SET TUBE TIP INTRA-ARTICULAR (MISCELLANEOUS) ×3 IMPLANT
SLING ULTRA II M (MISCELLANEOUS) ×3 IMPLANT
STRAP SAFETY 5IN WIDE (MISCELLANEOUS) ×3 IMPLANT
STRIP CLOSURE SKIN 1/2X4 (GAUZE/BANDAGES/DRESSINGS) ×2 IMPLANT
SUT ETHILON 4-0 (SUTURE) ×2
SUT ETHILON 4-0 FS2 18XMFL BLK (SUTURE) ×1
SUT LASSO 90 DEG SD STR (SUTURE) ×3 IMPLANT
SUT MNCRL 4-0 (SUTURE) ×2
SUT MNCRL 4-0 27XMFL (SUTURE) ×1
SUTURE ETHLN 4-0 FS2 18XMF BLK (SUTURE) ×1 IMPLANT
SUTURE MNCRL 4-0 27XMF (SUTURE) ×1 IMPLANT
SYR 10ML LL (SYRINGE) ×3 IMPLANT
TAPE CLOTH 3X10 WHT NS LF (GAUZE/BANDAGES/DRESSINGS) ×3 IMPLANT
TAPE MICROFOAM 4IN (TAPE) ×3 IMPLANT
TAPE SUT LABRALTAP WHT/BLK (SUTURE) ×9 IMPLANT
TUBING ARTHRO INFLOW-ONLY STRL (TUBING) ×3 IMPLANT
TUBING CONNECTING 10 (TUBING) ×2 IMPLANT
TUBING CONNECTING 10' (TUBING) ×1
WAND HAND CNTRL MULTIVAC 90 (MISCELLANEOUS) IMPLANT
WRAPON POLAR PAD SHDR XLG (MISCELLANEOUS) ×3

## 2018-08-21 NOTE — OR Nursing (Signed)
IV started preop right foot SL'd on arrival to postop.   New IV started in OR left hand, both d/c'd postop.

## 2018-08-21 NOTE — Anesthesia Procedure Notes (Addendum)
Anesthesia Regional Block: Interscalene brachial plexus block   Pre-Anesthetic Checklist: ,, timeout performed, Correct Patient, Correct Site, Correct Laterality, Correct Procedure, Correct Position, risks and benefits discussed, surgical consent, pre-op evaluation, post-op pain management  Laterality: Right  Prep: chloraprep       Needles:  Injection technique: Single-shot      Additional Needles:   Procedures:,,,, ultrasound used (permanent image in chart),,,,  Narrative:  Start time: 08/21/2018 7:30 AM End time: 08/21/2018 7:40 AM  Performed by: Personally  Anesthesiologist: Jovita Gamma, MD  Additional Notes: 20 mL Exparel 10 mL 0.5% bupivacaine

## 2018-08-21 NOTE — Op Note (Signed)
PRE-OP DIAGNOSIS:  1. Right shoulder multiple anterior dislocations   POST-OP DIAGNOSIS:  1. Right shoulder multiple anterior dislocations   PROCEDURES:  1. Right shoulder arthroscopic anterior labral repair and capsulorraphy  SURGEON: Rosealee Albee, MD  ASSISTANT(S):  Sonny Dandy, PA  ANESTHESIA: Regional + Gen  TOTAL IV FLUIDS: see anesthesia record  ESTIMATED BLOOD LOSS: minimal   DRAINS: None   SPECIMENS: None.   IMPLANTS:  Arthrex - 2.45mm PushLock anchor x 3  COMPLICATIONS: None   OPERATIVE FINDINGS:  Examination under anesthesia: A careful examination under anesthesia was performed.  Passive range of motion was: FF: 170; ER at side: 60; ER in abduction: 100; IR in abduction: 50.  Anterior load shift: 2+.  Posterior load shift: 1+.  Sulcus in neutral: 1+.  Sulcus in ER: 1+    Intra-operative findings: A thorough arthroscopic examination of the shoulder was performed.  The findings are: 1. Biceps tendon: mild erythema near anchor 2. Superior labrum: normal 3. Posterior labrum and capsule: normal 4. Inferior capsule and inferior recess: patulous capsule 5. Glenoid cartilage surface: normal 6. Supraspinatus attachment:  normal 7. Posterior rotator cuff attachment: normal, Hill-Sachs lesion present 8. Humeral head articular cartilage: normal 9. Rotator interval: mild synovitis 10: Subscapularis tendon: attachment intact 11. Anterior labrum: no labrum present from 3:00 - 5:30 o'clock positions; patulous anterior capsule 12. IGHL: stretched  OPERATIVE REPORT:   Indications for procedure: Carmen Singleton is a 57 y.o. year old female with chronic shoulder instability. She has had 5 prior dislocation episodes. She was evaluated after a dislocation in March 2019 and underwent a non-operative rehabilitation protocol successfully and returned to full activity. However, in September 2019, she had another dislocation while getting out of bed. MRI showed diminutive/blunted  anteroinferior labral tear with a patulous capsule. Surgery was recommended for anterior and labral repair and capsulorraphy to reduce the risk of recurrent dislocation.    Procedure in detail:  I identified Carmen Singleton in the pre-operative holding area. The risks, benefits, complications, treatment options, and expected outcomes were discussed with the patient. The risks and potential complications of their problem and purposed treatment including but not limited to failure to fully relieve pain, infection, neurovascular compromise, complications from anesthesia, stiff shoulder, and failure of surgery with continued pain. The patient concurred with the proposed plan, giving informed consent. I marked the operative shoulder with my initials.  Anesthesia was then performed with an interscalene block.  The patient was transferred to the operative suite and placed in the beach chair position.    SCDs were placed on the lower extremities. Appropriate IV antibiotics were administered prior to incision. The operative upper extremity was then prepped and draped in standard fashion. A time out was performed confirming the correct extremity, correct patient, and correct procedure.   I then created a standard posterior portal with an 11 blade. The glenohumeral joint was easily entered with a blunt trochar and the arthroscope introduced. A high anterior and medial portal was made. The findings of diagnostic arthroscopy are described above.  I then coagulated the inflamed synovium to obtain hemostasis and reduce the risk of post-operative swelling using an Arthrocare radiofrequency device. Next, an anterior inferior portal was made in a lateral position to allow for better angle at the glenoid face. A 7mm cannula was placed. An elevator was used to elevate the labrum off the glenoid. A rasp was used to roughen the glenoid for improvement of healing. A shaver was also used to debride degenerative  labrum and further  clean the glenoid face to allow for improved healing. The Arthrex SutureLasso SD (25 degree to the left) was then used to pass the nitinol wire loop through the capsule and under the labrum. This was passed at the 5:30 position in an inferior to superior fashion. Suture was shuttled through the nitinol wire and sutures were pulled out of the anterior inferior portals. The drill guide was placed on the glenoid face at 5:30 position and anchor hole was drilled.  A 2.9 mm PushLock was loaded onto to the suture. The anchor was advanced to the glenoid, the sutures tightened, and the anchor impacted with good purchase. This nicely tightened the labrum onto the glenoid as a bumper and tightened as well as shifted the capsule from inferior to superior. The suture tails were cut flush with the articular cartilage. This sequence of events was then repeated for 2 additional anchors at the 4:00 and 2:30 positions. The repair was stable to probing and there was an excellent anterior bumper. Additionally, previous ability to drive camera into anterior/inferior space was no longer possible.   Fluid was evacuated from the shoulder, and the portals were closed with 3-0 Nylon. Xeroform was applied to the portals. A sterile dressing was applied, followed by a Polar Care sleeve and a SlingShot shoulder immobilizer/sling. The patient awoke from anesthesia without difficulty and was transferred to the PACU in stable condition.   Of note, assistance from a PA was essential to performing the surgery. PA assisted with patient positioning, retraction, and instrumentation. The surgery would have been more difficult and had longer operative time without PA assistance.    DISPOSITION: plan for discharge home after recovery in PACU  POSTOPERATIVE PLAN: Remain in sling (except hygiene and elbow/wrist/hand RoM exercises as instructed by PT) x 3 weeks and NWB for this time. PT to begin 3-4 days after surgery. Anterior shoulder  stabilization/capsulorraphy protocol.

## 2018-08-21 NOTE — Anesthesia Procedure Notes (Signed)
Date/Time: 08/21/2018 8:04 AM Performed by: Danelle Berry, CRNA Pre-anesthesia Checklist: Patient identified, Emergency Drugs available, Suction available, Patient being monitored and Timeout performed Patient Re-evaluated:Patient Re-evaluated prior to induction Oxygen Delivery Method: Circle system utilized and Simple face mask Preoxygenation: Pre-oxygenation with 100% oxygen Induction Type: IV induction Ventilation: Mask ventilation without difficulty Laryngoscope Size: McGraph and 3 Grade View: Grade I Tube type: Oral Tube size: 7.0 mm Number of attempts: 1 Airway Equipment and Method: Stylet Placement Confirmation: ETT inserted through vocal cords under direct vision,  positive ETCO2 and breath sounds checked- equal and bilateral Secured at: 22 cm Tube secured with: Tape Dental Injury: Teeth and Oropharynx as per pre-operative assessment

## 2018-08-21 NOTE — Progress Notes (Signed)
Can wiggle fingers on right  Elevated on pillows  Capillary refill positive to right  Brace to right and polar care intact

## 2018-08-21 NOTE — Anesthesia Procedure Notes (Signed)
Performed by: Precious Segall E, CRNA       

## 2018-08-21 NOTE — OR Nursing (Signed)
Pt questions how long to use polar care and that MD said she could shower and she questions when can she shower.  Per Lupita Leash, in [place], per MD, patient can shower after PT on Friday.  Wear polar care as long as she wants to, the longer the better.  Pt/spouse notified of same.

## 2018-08-21 NOTE — Discharge Instructions (Addendum)
Post-Op Instructions  1. Bracing: You will wear a shoulder immobilizer or sling for at least 3 weeks. Total time will be determined by type of surgical repair and can be discussed at 1st postop appointment.  2. Driving: No driving for 3 weeks post-op. When driving, do not wear the immobilizer.  3. Activity: No active lifting for 2 months. Wrist, hand, and elbow motion only. Avoid lifting the upper arm away from the body except for hygiene. You are permitted to bend and straighten the elbow actively. You may use your hand and wrist for typing, writing, and managing utensils (cutting food). Do not lift more than a coffee cup for 8 weeks.  When sleeping or resting, inclined positions (recliner chair or wedge pillow) and a pillow under the forearm for support may provide better comfort for up to 4 weeks.  Avoid long distance travel for 4 weeks.  Return to normal activities after labral repair normally takes 6 months on average. If rehab goes very well, may be able to do most activities at 4 months, except overhead or contact sports.  4. Physical Therapy: Begins 3-4 days after surgery, and proceeds 2 times per week for the first 4 weeks, then 1-2 times per week from weeks 4-8 post-op.  5. Medications:  - You will be provided a prescription for narcotic pain medicine. After surgery, take 1-2 narcotic tablets every 4 hours if needed for severe pain.  - A prescription for anti-nausea medication will be provided in case the narcotic medicine causes nausea - take 1 tablet every 6 hours only if nauseated.   - Take tylenol 1000 mg every 8 hours for pain.  May stop tylenol 3 days after surgery if you are having minimal pain. - Resume home Xarelto on the day after surgery for DVT prophylaxis.   If you are taking prescription medication for anxiety, depression, insomnia, muscle spasm, chronic pain, or for attention deficit disorder, you are advised that you are at a higher risk of adverse effects with use of  narcotics post-op, including narcotic addiction/dependence, depressed breathing, death. If you use non-prescribed substances: alcohol, marijuana, cocaine, heroin, methamphetamines, etc., you are at a higher risk of adverse effects with use of narcotics post-op, including narcotic addiction/dependence, depressed breathing, death. You are advised that taking > 50 morphine milligram equivalents (MME) of narcotic pain medication per day results in twice the risk of overdose or death. For your prescription provided: oxycodone 5 mg - taking more than 6 tablets per day would result in > 50 morphine milligram equivalents (MME) of narcotic pain medication. Be advised that we will prescribe narcotics short-term, for acute post-operative pain only - 3 weeks for major operations such as shoulder repair/reconstruction surgeries.   6. Post-Op Appointment:  Your first post-op appointment will be 10-14 days post-op.  7. Work or School: For most, but not all procedures, we advise staying out of work or school for at least 1 to 2 weeks in order to recover from the stress of surgery and to allow time for healing.   If you need a work or school note this can be provided.   Post-operative Brace: Apply and remove the brace you received as you were instructed to at the time of fitting and as described in detail as the braces instructions for use indicate.  Wear the brace for the period of time prescribed by your physician.  The brace can be cleaned with soap and water and allowed to air dry only.  Should the brace  result in increased pain, decreased feeling (numbness/tingling), increased swelling or an overall worsening of your medical condition, please contact your doctor immediately.  If an emergency situation occurs as a result of wearing the brace after normal business hours, please dial 911 and seek immediate medical attention.  Let your doctor know if you have any further questions about the brace issued to  you. Refer to the shoulder sling instructions for use if you have any questions regarding the correct fit of your shoulder sling.  [employer] Customer Care for Troubleshooting: [phone]  Video that illustrates how to properly use a shoulder sling: "Instructions for Proper Use of an Orthopaedic Sling" http://bass.com/    AMBULATORY SURGERY  DISCHARGE INSTRUCTIONS   1) The drugs that you were given will stay in your system until tomorrow so for the next 24 hours you should not:  A) Drive an automobile B) Make any legal decisions C) Drink any alcoholic beverage   2) You may resume regular meals tomorrow.  Today it is better to start with liquids and gradually work up to solid foods.  You may eat anything you prefer, but it is better to start with liquids, then soup and crackers, and gradually work up to solid foods.   3) Please notify your doctor immediately if you have any unusual bleeding, trouble breathing, redness and pain at the surgery site, drainage, fever, or pain not relieved by medication.    4) Additional Instructions:        Please contact your physician with any problems or Same Day Surgery at [phone], Monday through Friday 6 am to 4 pm, or [place] [facility] number at [phone].

## 2018-08-21 NOTE — Anesthesia Post-op Follow-up Note (Signed)
Anesthesia QCDR form completed.        

## 2018-08-21 NOTE — Transfer of Care (Signed)
Immediate Anesthesia Transfer of Care Note  Patient: Carmen Singleton  Procedure(s) Performed: SHOULDER ARTHROSCOPY WITH ANTERIOR LABRAL REPAIR, CAPSULORRAPHY, POSSIBLE BICEPS TENODESIS (Right )  Patient Location: PACU  Anesthesia Type:General  Level of Consciousness: awake, alert  and oriented  Airway & Oxygen Therapy: Patient Spontanous Breathing and Patient connected to face mask oxygen  Post-op Assessment: Report given to RN and Post -op Vital signs reviewed and stable  Post vital signs: Reviewed and stable  Last Vitals:  Vitals Value Taken Time  BP 130/83 08/21/2018 10:07 AM  Temp    Pulse 81 08/21/2018 10:09 AM  Resp 15 08/21/2018 10:09 AM  SpO2 100 % 08/21/2018 10:09 AM  Vitals shown include unvalidated device data.  Last Pain:  Vitals:   08/21/18 0742  TempSrc:   PainSc: 0-No pain         Complications: No apparent anesthesia complications

## 2018-08-21 NOTE — H&P (Signed)
Paper H&P to be scanned into permanent record. H&P reviewed. No significant changes noted.  

## 2018-08-23 NOTE — Anesthesia Postprocedure Evaluation (Signed)
Anesthesia Post Note  Patient: Carmen Singleton  Procedure(s) Performed: SHOULDER ARTHROSCOPY WITH ANTERIOR LABRAL REPAIR, CAPSULORRAPHY (Right Shoulder)  Patient location during evaluation: PACU Anesthesia Type: Regional Level of consciousness: awake and alert Pain management: pain level controlled Vital Signs Assessment: post-procedure vital signs reviewed and stable Respiratory status: spontaneous breathing, nonlabored ventilation and respiratory function stable Cardiovascular status: blood pressure returned to baseline and stable Postop Assessment: no apparent nausea or vomiting Anesthetic complications: no     Last Vitals:  Vitals:   08/21/18 1045 08/21/18 1126  BP: 112/73 113/71  Pulse: 64 64  Resp:  16  Temp: (!) 35.9 C (!) 36.2 C  SpO2: 98% 100%    Last Pain:  Vitals:   08/21/18 1126  TempSrc: Temporal  PainSc: 0-No pain                 Jovita Gamma

## 2018-10-02 ENCOUNTER — Other Ambulatory Visit: Payer: Self-pay | Admitting: Certified Nurse Midwife

## 2018-10-02 DIAGNOSIS — Z1231 Encounter for screening mammogram for malignant neoplasm of breast: Secondary | ICD-10-CM

## 2018-10-09 ENCOUNTER — Other Ambulatory Visit: Payer: Self-pay | Admitting: Orthopedic Surgery

## 2018-10-09 DIAGNOSIS — M25562 Pain in left knee: Secondary | ICD-10-CM

## 2018-10-25 ENCOUNTER — Ambulatory Visit
Admission: RE | Admit: 2018-10-25 | Discharge: 2018-10-25 | Disposition: A | Discharge status: Home / Self Care | Payer: Managed Care, Other (non HMO) | Source: Ambulatory Visit | Attending: Orthopedic Surgery | Admitting: Orthopedic Surgery

## 2018-10-25 DIAGNOSIS — S83242A Other tear of medial meniscus, current injury, left knee, initial encounter: Secondary | ICD-10-CM | POA: Diagnosis not present

## 2018-10-25 DIAGNOSIS — M25562 Pain in left knee: Secondary | ICD-10-CM | POA: Diagnosis not present

## 2018-10-25 DIAGNOSIS — M1712 Unilateral primary osteoarthritis, left knee: Secondary | ICD-10-CM | POA: Diagnosis not present

## 2018-10-25 DIAGNOSIS — X58XXXA Exposure to other specified factors, initial encounter: Secondary | ICD-10-CM | POA: Insufficient documentation

## 2018-11-01 ENCOUNTER — Ambulatory Visit
Admission: RE | Admit: 2018-11-01 | Discharge: 2018-11-01 | Disposition: A | Discharge status: Home / Self Care | Payer: Managed Care, Other (non HMO) | Source: Ambulatory Visit | Attending: Certified Nurse Midwife | Admitting: Certified Nurse Midwife

## 2018-11-01 DIAGNOSIS — Z1231 Encounter for screening mammogram for malignant neoplasm of breast: Secondary | ICD-10-CM | POA: Insufficient documentation

## 2018-11-03 ENCOUNTER — Other Ambulatory Visit: Payer: Managed Care, Other (non HMO)

## 2018-11-03 ENCOUNTER — Encounter
Admission: RE | Admit: 2018-11-03 | Discharge: 2018-11-03 | Disposition: A | Discharge status: Home / Self Care | Payer: Managed Care, Other (non HMO) | Source: Ambulatory Visit | Attending: Orthopedic Surgery | Admitting: Orthopedic Surgery

## 2018-11-03 ENCOUNTER — Other Ambulatory Visit: Payer: Self-pay

## 2018-11-03 DIAGNOSIS — X58XXXA Exposure to other specified factors, initial encounter: Secondary | ICD-10-CM | POA: Insufficient documentation

## 2018-11-03 DIAGNOSIS — Z01818 Encounter for other preprocedural examination: Secondary | ICD-10-CM

## 2018-11-03 DIAGNOSIS — S83242A Other tear of medial meniscus, current injury, left knee, initial encounter: Secondary | ICD-10-CM | POA: Insufficient documentation

## 2018-11-03 DIAGNOSIS — Z79899 Other long term (current) drug therapy: Secondary | ICD-10-CM | POA: Diagnosis not present

## 2018-11-03 DIAGNOSIS — M1712 Unilateral primary osteoarthritis, left knee: Secondary | ICD-10-CM | POA: Diagnosis not present

## 2018-11-03 DIAGNOSIS — K219 Gastro-esophageal reflux disease without esophagitis: Secondary | ICD-10-CM | POA: Diagnosis not present

## 2018-11-03 DIAGNOSIS — Z8673 Personal history of transient ischemic attack (TIA), and cerebral infarction without residual deficits: Secondary | ICD-10-CM | POA: Diagnosis not present

## 2018-11-03 DIAGNOSIS — Z87891 Personal history of nicotine dependence: Secondary | ICD-10-CM | POA: Diagnosis not present

## 2018-11-03 LAB — BASIC METABOLIC PANEL
Anion gap: 10 (ref 5–15)
BUN: 19 mg/dL (ref 6–20)
CALCIUM: 9.6 mg/dL (ref 8.9–10.3)
CO2: 24 mmol/L (ref 22–32)
Chloride: 104 mmol/L (ref 98–111)
Creatinine, Ser: 0.81 mg/dL (ref 0.44–1.00)
GFR calc Af Amer: >60 mL/min (ref >60)
GFR calc non Af Amer: >60 mL/min (ref >60)
GLUCOSE: 69 mg/dL — AB (ref 70–99)
Potassium: 3.4 mmol/L — ABNORMAL LOW (ref 3.5–5.1)
Sodium: 138 mmol/L (ref 135–145)

## 2018-11-03 LAB — CBC
HEMATOCRIT: 45.9 % (ref 36.0–46.0)
Hemoglobin: 15.1 g/dL — ABNORMAL HIGH (ref 12.0–15.0)
MCH: 30.7 pg (ref 26.0–34.0)
MCHC: 32.9 g/dL (ref 30.0–36.0)
MCV: 93.3 fL (ref 80.0–100.0)
Platelets: 252 10*3/uL (ref 150–400)
RBC: 4.92 MIL/uL (ref 3.87–5.11)
RDW: 12.6 % (ref 11.5–15.5)
WBC: 5.9 10*3/uL (ref 4.0–10.5)
nRBC: 0 % (ref 0.0–0.2)

## 2018-11-03 LAB — PROTIME-INR
INR: 1.56
Prothrombin Time: 18.5 seconds — ABNORMAL HIGH (ref 11.4–15.2)

## 2018-11-03 LAB — APTT: aPTT: 37 seconds — ABNORMAL HIGH (ref 24–36)

## 2018-11-03 NOTE — Patient Instructions (Signed)
  Your procedure is scheduled on: Monday November 06, 2018 Report to Same Day Surgery 2nd floor [facility](Medical Mall Entrance-take elevator on left to 2nd floor.  Check in with surgery information desk.) To find out your arrival time, call [phone] 1:00-3:00 PM today Friday November 03, 2018  Remember: Instructions that are not followed completely may result in serious medical risk, up to and including death, or upon the discretion of your surgeon and anesthesiologist your surgery may need to be rescheduled.    __x__ 1. Do not eat food (including mints, candies, chewing gum) after midnight the night before your procedure. You may drink clear liquids up to 2 hours before you are scheduled to arrive at the hospital for your procedure.  Do not drink anything within 2 hours of your scheduled arrival to the hospital.  Approved clear liquids:  --Water or Apple juice without pulp  --Clear carbohydrate beverage such as Gatorade or Powerade  --Black Coffee or Clear Tea (No milk, no creamers, do not add anything to the coffee or tea)    __x__ 2. No Alcohol for 24 hours before or after surgery.   __x__ 3. No Smoking or e-cigarettes for 24 hours before surgery.  Do not use any chewable tobacco products for at least 6 hours before surgery.   __x__ 4. Notify your doctor if there is any change in your medical condition (cold, fever, infections).   __x__ 5. On the morning of surgery brush your teeth with toothpaste and water.  You may rinse your mouth with mouthwash if you wish.  Do not swallow any toothpaste or mouthwash.  Please read over the following fact sheets that you were given:   [facility] Preparing for [facility] and/or MRSA Information    __x__ Use CHG Soap or Sage wipes as directed on instruction sheet   Do not wear jewelry, make-up, hairpins, clips or nail polish on the day of surgery.  Do not wear lotions, powders, deodorant, or perfumes.   Do not shave below the face/neck 48  hours prior to surgery.   Do not bring valuables to the hospital.    [facility] is not responsible for any belongings or valuables.               Contacts, dentures or bridgework may not be worn into surgery.  For patients discharged on the day of surgery, you will NOT be permitted to drive yourself home.  You must have a responsible adult with you for 24 hours after surgery.  __x__ Take medicines listed below with a SMALL SIP OF WATER on the morning of surgery:  1. Pantoprazole (Protonix)  2. Tylenol if needed for pain  __x__ Follow recommendations from Cardiologist, Pulmonologist or PCP regarding stopping Xarelto (2 days prior to surgery)  __x__ TODAY: Avoid anti-inflammatories such as Advil, Ibuprofen, Motrin, Aleve, Naproxen, Naprosyn, BC/Goodies powders or aspirin products. You may continue to take Tylenol and Celebrex.   __x__ TODAY: Avoid supplements until after surgery. You may continue to take Vitamin D, Vitamin B, and multivitamin.

## 2018-11-06 ENCOUNTER — Ambulatory Visit: Payer: Managed Care, Other (non HMO) | Admitting: Anesthesiology

## 2018-11-06 ENCOUNTER — Encounter: Payer: Self-pay | Admitting: Anesthesiology

## 2018-11-06 ENCOUNTER — Ambulatory Visit
Admission: RE | Admit: 2018-11-06 | Discharge: 2018-11-06 | Disposition: A | Discharge status: Home / Self Care | Payer: Managed Care, Other (non HMO) | Attending: Orthopedic Surgery | Admitting: Orthopedic Surgery

## 2018-11-06 ENCOUNTER — Other Ambulatory Visit: Payer: Self-pay

## 2018-11-06 ENCOUNTER — Encounter
Admission: RE | Disposition: A | Discharge status: Home / Self Care | Payer: Self-pay | Source: Home / Self Care | Attending: Orthopedic Surgery

## 2018-11-06 DIAGNOSIS — Z8673 Personal history of transient ischemic attack (TIA), and cerebral infarction without residual deficits: Secondary | ICD-10-CM | POA: Insufficient documentation

## 2018-11-06 DIAGNOSIS — Z79899 Other long term (current) drug therapy: Secondary | ICD-10-CM | POA: Insufficient documentation

## 2018-11-06 DIAGNOSIS — S83242A Other tear of medial meniscus, current injury, left knee, initial encounter: Secondary | ICD-10-CM | POA: Insufficient documentation

## 2018-11-06 DIAGNOSIS — M1712 Unilateral primary osteoarthritis, left knee: Secondary | ICD-10-CM | POA: Insufficient documentation

## 2018-11-06 DIAGNOSIS — Z87891 Personal history of nicotine dependence: Secondary | ICD-10-CM | POA: Insufficient documentation

## 2018-11-06 DIAGNOSIS — X58XXXA Exposure to other specified factors, initial encounter: Secondary | ICD-10-CM | POA: Insufficient documentation

## 2018-11-06 DIAGNOSIS — K219 Gastro-esophageal reflux disease without esophagitis: Secondary | ICD-10-CM | POA: Insufficient documentation

## 2018-11-06 HISTORY — PX: KNEE ARTHROSCOPY: SHX127

## 2018-11-06 LAB — PROTIME-INR
INR: 1.02
Prothrombin Time: 13.3 seconds (ref 11.4–15.2)

## 2018-11-06 SURGERY — ARTHROSCOPY, KNEE
Anesthesia: General | Site: Knee | Laterality: Left

## 2018-11-06 MED ORDER — ONDANSETRON HCL 4 MG/2ML IJ SOLN: 4.0000 mg | Freq: Once | INTRAMUSCULAR | Status: DC | PRN

## 2018-11-06 MED ORDER — BUPIVACAINE HCL (PF) 0.5 % IJ SOLN
INTRAMUSCULAR | Status: AC
  Filled 2018-11-06: qty 30

## 2018-11-06 MED ORDER — LACTATED RINGERS IV SOLN
INTRAVENOUS | Status: DC
  Administered 2018-11-06: 15:00:00 via INTRAVENOUS

## 2018-11-06 MED ORDER — FENTANYL CITRATE (PF) 100 MCG/2ML IJ SOLN
INTRAMUSCULAR | Status: AC
  Filled 2018-11-06: qty 2

## 2018-11-06 MED ORDER — IBUPROFEN 800 MG PO TABS: 800.0000 mg | ORAL_TABLET | Freq: Three times a day (TID) | ORAL | 0 refills | Status: AC

## 2018-11-06 MED ORDER — BUPIVACAINE HCL (PF) 0.5 % IJ SOLN
INTRAMUSCULAR | Status: DC | PRN
  Administered 2018-11-06: 30 mL

## 2018-11-06 MED ORDER — LIDOCAINE HCL (CARDIAC) PF 100 MG/5ML IV SOSY
PREFILLED_SYRINGE | INTRAVENOUS | Status: DC | PRN
  Administered 2018-11-06: 100 mg via INTRAVENOUS

## 2018-11-06 MED ORDER — LIDOCAINE-EPINEPHRINE 1 %-1:100000 IJ SOLN
INTRAMUSCULAR | Status: AC
  Filled 2018-11-06: qty 1

## 2018-11-06 MED ORDER — LACTATED RINGERS IR SOLN
Status: DC | PRN
  Administered 2018-11-06 (×2): 3000 mL

## 2018-11-06 MED ORDER — PROPOFOL 10 MG/ML IV BOLUS
INTRAVENOUS | Status: AC
  Filled 2018-11-06: qty 20

## 2018-11-06 MED ORDER — FENTANYL CITRATE (PF) 100 MCG/2ML IJ SOLN
25.0000 ug | INTRAMUSCULAR | Status: AC | PRN
  Administered 2018-11-06 (×6): 25 ug via INTRAVENOUS

## 2018-11-06 MED ORDER — CEFAZOLIN SODIUM-DEXTROSE 2-4 GM/100ML-% IV SOLN
2.0000 g | Freq: Once | INTRAVENOUS | Status: AC
  Administered 2018-11-06: 2 g via INTRAVENOUS

## 2018-11-06 MED ORDER — FENTANYL CITRATE (PF) 100 MCG/2ML IJ SOLN
INTRAMUSCULAR | Status: DC | PRN
  Administered 2018-11-06 (×2): 50 ug via INTRAVENOUS

## 2018-11-06 MED ORDER — ONDANSETRON 4 MG PO TBDP: 4.0000 mg | ORAL_TABLET | Freq: Three times a day (TID) | ORAL | 0 refills | Status: AC | PRN

## 2018-11-06 MED ORDER — ACETAMINOPHEN 10 MG/ML IV SOLN
INTRAVENOUS | Status: AC
  Filled 2018-11-06: qty 100

## 2018-11-06 MED ORDER — MIDAZOLAM HCL 2 MG/2ML IJ SOLN
INTRAMUSCULAR | Status: DC | PRN
  Administered 2018-11-06: 2 mg via INTRAVENOUS

## 2018-11-06 MED ORDER — ACETAMINOPHEN 10 MG/ML IV SOLN
INTRAVENOUS | Status: DC | PRN
  Administered 2018-11-06: 1000 mg via INTRAVENOUS

## 2018-11-06 MED ORDER — OXYCODONE HCL 5 MG PO TABS
ORAL_TABLET | ORAL | Status: AC
  Filled 2018-11-06: qty 2

## 2018-11-06 MED ORDER — CEFAZOLIN SODIUM-DEXTROSE 2-4 GM/100ML-% IV SOLN
INTRAVENOUS | Status: AC
  Filled 2018-11-06: qty 100

## 2018-11-06 MED ORDER — HYDROCODONE-ACETAMINOPHEN 5-325 MG PO TABS: 1.0000 | ORAL_TABLET | ORAL | 0 refills | Status: DC | PRN

## 2018-11-06 MED ORDER — OXYCODONE HCL 5 MG PO TABS
10.0000 mg | ORAL_TABLET | ORAL | Status: AC | PRN
  Administered 2018-11-06: 10 mg via ORAL

## 2018-11-06 MED ORDER — OXYCODONE HCL ER 10 MG PO T12A
EXTENDED_RELEASE_TABLET | ORAL | Status: AC
  Filled 2018-11-06: qty 1

## 2018-11-06 MED ORDER — ACETAMINOPHEN 500 MG PO TABS: 1000.0000 mg | ORAL_TABLET | Freq: Three times a day (TID) | ORAL | 2 refills | Status: AC

## 2018-11-06 MED ORDER — ONDANSETRON HCL 4 MG/2ML IJ SOLN
INTRAMUSCULAR | Status: DC | PRN
  Administered 2018-11-06: 4 mg via INTRAVENOUS

## 2018-11-06 MED ORDER — MIDAZOLAM HCL 2 MG/2ML IJ SOLN
INTRAMUSCULAR | Status: AC
  Filled 2018-11-06: qty 2

## 2018-11-06 MED ORDER — DEXAMETHASONE SODIUM PHOSPHATE 10 MG/ML IJ SOLN
INTRAMUSCULAR | Status: DC | PRN
  Administered 2018-11-06: 10 mg via INTRAVENOUS

## 2018-11-06 MED ORDER — PROPOFOL 10 MG/ML IV BOLUS
INTRAVENOUS | Status: DC | PRN
  Administered 2018-11-06: 180 mg via INTRAVENOUS
  Administered 2018-11-06: 20 mg via INTRAVENOUS

## 2018-11-06 SURGICAL SUPPLY — 44 items
ADAPTER IRRIG TUBE 2 SPIKE SOL (ADAPTER) ×6 IMPLANT
BLADE SURG SZ11 CARB STEEL (BLADE) ×3 IMPLANT
BNDG ESMARK 6X12 TAN STRL LF (GAUZE/BANDAGES/DRESSINGS) IMPLANT
BRUSH SCRUB EZ  4% CHG (MISCELLANEOUS) ×2
BRUSH SCRUB EZ 4% CHG (MISCELLANEOUS) ×1 IMPLANT
BUR RADIUS 3.5 (BURR) ×3 IMPLANT
BUR RADIUS 4.0X18.5 (BURR) ×3 IMPLANT
CHLORAPREP W/TINT 26ML (MISCELLANEOUS) ×3 IMPLANT
CLOSURE WOUND 1/2 X4 (GAUZE/BANDAGES/DRESSINGS) ×1
COOLER POLAR GLACIER W/PUMP (MISCELLANEOUS) ×3 IMPLANT
COVER WAND RF STERILE (DRAPES) ×3 IMPLANT
CUFF TOURN 24 STER (MISCELLANEOUS) IMPLANT
CUFF TOURN 30 STER DUAL PORT (MISCELLANEOUS) IMPLANT
DRAPE IMP U-DRAPE 54X76 (DRAPES) ×3 IMPLANT
GAUZE SPONGE 4X4 12PLY STRL (GAUZE/BANDAGES/DRESSINGS) ×3 IMPLANT
GAUZE XEROFORM 4X4 STRL (GAUZE/BANDAGES/DRESSINGS) ×3 IMPLANT
GLOVE BIOGEL PI IND STRL 8 (GLOVE) ×1 IMPLANT
GLOVE BIOGEL PI INDICATOR 8 (GLOVE) ×2
GLOVE SURG SYN 7.5  E (GLOVE) ×2
GLOVE SURG SYN 7.5 E (GLOVE) ×1 IMPLANT
GOWN STRL REUS W/ TWL LRG LVL3 (GOWN DISPOSABLE) ×1 IMPLANT
GOWN STRL REUS W/TWL LRG LVL3 (GOWN DISPOSABLE) ×2
GOWN STRL REUS W/TWL LRG LVL4 (GOWN DISPOSABLE) ×3 IMPLANT
IV LACTATED RINGER IRRG 3000ML (IV SOLUTION) ×8
IV LR IRRIG 3000ML ARTHROMATIC (IV SOLUTION) ×4 IMPLANT
KIT TURNOVER KIT A (KITS) ×3 IMPLANT
MANIFOLD NEPTUNE II (INSTRUMENTS) ×3 IMPLANT
MAT ABSORB  FLUID 56X50 GRAY (MISCELLANEOUS) ×2
MAT ABSORB FLUID 56X50 GRAY (MISCELLANEOUS) ×1 IMPLANT
NEEDLE HYPO 22GX1.5 SAFETY (NEEDLE) ×3 IMPLANT
PACK ARTHROSCOPY KNEE (MISCELLANEOUS) ×3 IMPLANT
PAD ABD DERMACEA PRESS 5X9 (GAUZE/BANDAGES/DRESSINGS) ×6 IMPLANT
PAD WRAPON POLAR KNEE (MISCELLANEOUS) ×1 IMPLANT
SET TUBE SUCT SHAVER OUTFL 24K (TUBING) ×3 IMPLANT
SET TUBE TIP INTRA-ARTICULAR (MISCELLANEOUS) ×3 IMPLANT
STOCKINETTE BIAS CUT 4 980044 (GAUZE/BANDAGES/DRESSINGS) ×3 IMPLANT
STRIP CLOSURE SKIN 1/2X4 (GAUZE/BANDAGES/DRESSINGS) ×2 IMPLANT
SUT ETHILON 4-0 (SUTURE) ×2
SUT ETHILON 4-0 FS2 18XMFL BLK (SUTURE) ×1
SUTURE ETHLN 4-0 FS2 18XMF BLK (SUTURE) ×1 IMPLANT
TUBING ARTHRO INFLOW-ONLY STRL (TUBING) ×3 IMPLANT
WAND HAND CNTRL MULTIVAC 50 (MISCELLANEOUS) IMPLANT
WAND HAND CNTRL MULTIVAC 90 (MISCELLANEOUS) IMPLANT
WRAPON POLAR PAD KNEE (MISCELLANEOUS) ×3

## 2018-11-06 NOTE — Op Note (Signed)
Operative Note    SURGERY DATE: 11/06/2018   PRE-OP DIAGNOSIS:  1. Left medial meniscus tear 2. Left medial compartment degenerative changes   POST-OP DIAGNOSIS:  1. Left medial meniscus tear 2. Left medial compartment degenerative changes   PROCEDURES:  1. Left knee arthroscopy, partial medial meniscectomy 2. Left knee chondroplasty of medial compartment   SURGEON: Rosealee AlbeeSunny H. Aubry Tucholski, MD   ANESTHESIA: Gen   ESTIMATED BLOOD LOSS: minimal   TOTAL IV FLUIDS: per anesthesia   INDICATION(S):  Carmen Singleton is a 57 y.o. female with signs and symptoms as well as MRI finding of medial meniscus tear. After discussion of risks, benefits, and alternatives to surgery, the patient elected to proceed.   OPERATIVE FINDINGS:    Examination under anesthesia: A careful examination under anesthesia was performed.  Passive range of motion was: Hyperextension: 2.  Extension: 0.  Flexion: 130.  Lachman: normal. Pivot Shift: normal.  Posterior drawer: normal.  Varus stability in full extension: normal.  Varus stability in 30 degrees of flexion: normal.  Valgus stability in full extension: normal.  Valgus stability in 30 degrees of flexion: normal.   Intra-operative findings: A thorough arthroscopic examination of the knee was performed.  The findings are: 1. Suprapatellar pouch: Normal 2. Undersurface of median ridge: Grade 1 softening 3. Medial patellar facet: Focal area of Grade 2-3 degenerative changes 4. Lateral patellar facet: Grade 1 softening 5. Trochlea: Normal 6. Lateral gutter/popliteus tendon: Normal 7. Hoffa's fat pad: Inflamed 8. Medial gutter/plica: Normal 9. ACL: Normal 10. PCL: Normal 11. Medial meniscus: Radial tear of the body affecting approximately 95% of the meniscus with 12. Medial compartment cartilage: Multiple areas of grade 3-4 degenerative changes affecting the majority of the medial femoral condyle, grade 1-2 degenerative changes of the tibial plateau 13. Lateral  meniscus: Normal 14. Lateral compartment cartilage: Grade 1-2 degenerative changes of the tibial plateau, normal lateral femoral condyle.   OPERATIVE REPORT:     I identified Carmen Singleton in the pre-operative holding area. I marked the operative knee with my initials. I reviewed the risks and benefits of the proposed surgical intervention and the patient (and/or patient's guardian) wished to proceed. The patient was transferred to the operative suite and placed in the supine position with all bony prominences padded.  Anesthesia was administered. Appropriate IV antibiotics were administered prior to incision. The extremity was then prepped and draped in standard fashion. A time out was performed confirming the correct extremity, correct patient, and correct procedure.   Arthroscopy portals were marked. Local anesthetic was injected to the planned portal sites. The anterolateral portal was established with an 11 blade.      The arthroscope was placed in the anterolateral portal and then into the suprapatellar pouch.  A diagnostic knee scope was completed with the above findings. The medial meniscus tear was identified.   Next the medial portal was established under needle localization. The meniscal tear was debrided using an arthroscopic biter and an oscillating shaver until the meniscus had stable borders.  There was approximately 1-2 mm of meniscus rim remaining at the most severe extent of the tear after debridement.  A chondroplasty was performed of the medial compartment such that there were stable cartilage edges without any loose fragments of cartilage. Arthroscopic fluid was removed from the joint.   The portals were closed with 3-0 Nylon suture. Sterile dressings included Xeroform, 4x4s, Sof-Rol, and Bias wrap. A Polarcare was placed.  The patient was then awakened and taken to the PACU hemodynamically  stable without complication.     POSTOPERATIVE PLAN: The patient will be discharged home  today once they meet PACU criteria.  Resume home Xarelto on postoperative day #1 for DVT prophylaxis.  Physical therapy will start on POD#3-4. Weight-bearing as tolerated. Follow up in 2 weeks per protocol.

## 2018-11-06 NOTE — Transfer of Care (Signed)
Immediate Anesthesia Transfer of Care Note  Patient: Carmen Singleton  Procedure(s) Performed: LEFT ARTHROSCOPY KNEE PARTIAL MEDIAL MENISECTOMY (Left Knee)  Patient Location: PACU  Anesthesia Type:General  Level of Consciousness: awake and alert   Airway & Oxygen Therapy: Patient Spontanous Breathing and Patient connected to face mask oxygen  Post-op Assessment: Report given to RN and Post -op Vital signs reviewed and stable  Post vital signs: Reviewed and stable  Last Vitals:  Vitals Value Taken Time  BP 128/79 11/06/2018  5:28 PM  Temp 36.6 C 11/06/2018  5:28 PM  Pulse 81 11/06/2018  5:30 PM  Resp 13 11/06/2018  5:30 PM  SpO2 100 % 11/06/2018  5:30 PM  Vitals shown include unvalidated device data.  Last Pain:  Vitals:   11/06/18 1728  TempSrc:   PainSc: 0-No pain         Complications: No apparent anesthesia complications

## 2018-11-06 NOTE — Anesthesia Post-op Follow-up Note (Signed)
Anesthesia QCDR form completed.        

## 2018-11-06 NOTE — Anesthesia Preprocedure Evaluation (Signed)
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 Had a stroke at that time. IVC filter has been removed/ EKG ok.  Reproductive/Obstetrics                             Anesthesia Physical Anesthesia Plan  ASA: III  Anesthesia Plan: General   Post-op Pain Management:    Induction: Intravenous  PONV Risk Score and Plan:   Airway Management Planned: LMA  Additional Equipment:   Intra-op Plan:   Post-operative Plan:   Informed Consent: I have reviewed the patients History and Physical, chart, labs and discussed the procedure including the risks, benefits and alternatives for the proposed anesthesia with the patient or authorized representative who has indicated his/her understanding and acceptance.     Plan Discussed with: CRNA  Anesthesia Plan Comments:         Anesthesia Quick Evaluation

## 2018-11-06 NOTE — H&P (Signed)
Paper H&P to be scanned into permanent record. H&P reviewed. No significant changes noted.  

## 2018-11-06 NOTE — Discharge Instructions (Signed)
AMBULATORY SURGERY  DISCHARGE INSTRUCTIONS   1) The drugs that you were given will stay in your system until tomorrow so for the next 24 hours you should not:  A) Drive an automobile B) Make any legal decisions C) Drink any alcoholic beverage   2) You may resume regular meals tomorrow.  Today it is better to start with liquids and gradually work up to solid foods.  You may eat anything you prefer, but it is better to start with liquids, then soup and crackers, and gradually work up to solid foods.   3) Please notify your doctor immediately if you have any unusual bleeding, trouble breathing, redness and pain at the surgery site, drainage, fever, or pain not relieved by medication. 4)   5) Your post-operative visit with Dr.                                     is: Date:                        Time:    Please call to schedule your post-operative visit.  6) Additional Instructions:      Arthroscopic Knee Surgery - Partial Meniscectomy   Post-Op Instructions   1. Bracing or crutches: Crutches will be provided at the time of discharge from the surgery center if you do not already have them.   2. Ice: You may be provided with a device [facility](Polar Care) that allows you to ice the affected area effectively. Otherwise you can ice manually.    3. Driving:  Plan on not driving for at least two weeks. Please note that you are advised NOT to drive while taking narcotic pain medications as you may be impaired and unsafe to drive.   4. Activity: Ankle pumps several times an hour while awake to prevent blood clots. Weight bearing: as tolerated. Use crutches for as needed (usually ~1 week or less) until pain allows you to ambulate without a limp. Bending and straightening the knee is unlimited. Elevate knee above heart level as much as possible for one week. Avoid standing more than 5 minutes (consecutively) for the first week.  Avoid long distance travel for 2 weeks.  5. Medications:  - You  have been provided a prescription for narcotic pain medicine. After surgery, take 1-2 narcotic tablets every 4 hours if needed for severe pain.  - You may take up to 3000mg /day of tylenol (acetaminophen). You can take 1000mg  3x/day. Please check your narcotic. If you have acetaminophen in your narcotic (each tablet will be 325mg ), be careful not to exceed a total of 3000mg /day of acetaminophen.  - A prescription for anti-nausea medication will be provided in case the narcotic medicine or anesthesia causes nausea - take 1 tablet every 6 hours only if nauseated.   - Resume home Xarelto on postop day #1   6. Bandages: The physical therapist should change the bandages at the first post-op appointment. If needed, the dressing supplies have been provided to you.   7. Physical Therapy: 1-2 times per week for 6 weeks. Therapy typically starts on post operative Day 3 or 4. You have been provided an order for physical therapy. The therapist will provide home exercises.   8. Work: May return to full work usually around 2 weeks after 1st post-operative visit. May do light duty/desk job in approximately 1-2 weeks when off of narcotics,  pain is well-controlled, and swelling has decreased. Labor intensive jobs may require 4-6 weeks to return.      9. Post-Op Appointments: Your first post-op appointment will be with Dr. Allena KatzPatel in approximately 2 weeks time.    If you find that they have not been scheduled please call the Orthopaedic Appointment front desk at [phone].

## 2018-11-06 NOTE — Anesthesia Postprocedure Evaluation (Signed)
Anesthesia Post Note  Patient: Carmen Singleton  Procedure(s) Performed: LEFT ARTHROSCOPY KNEE PARTIAL MEDIAL MENISECTOMY (Left Knee)  Patient location during evaluation: PACU Anesthesia Type: General Level of consciousness: awake and alert Pain management: pain level controlled Vital Signs Assessment: post-procedure vital signs reviewed and stable Respiratory status: spontaneous breathing and respiratory function stable Cardiovascular status: stable Anesthetic complications: no     Last Vitals:  Vitals:   11/06/18 1828 11/06/18 1832  BP: 118/77 120/78  Pulse: 66 74  Resp: 11 19  Temp: 36.6 C   SpO2: 100% 100%    Last Pain:  Vitals:   11/06/18 1832  TempSrc:   PainSc: 6                  Contrell Ballentine K

## 2018-11-06 NOTE — Anesthesia Procedure Notes (Signed)
Procedure Name: LMA Insertion Date/Time: 11/06/2018 4:31 PM Performed by: Ginger CarneMichelet, Vincie Linn, CRNA Pre-anesthesia Checklist: Patient identified, Emergency Drugs available, Suction available, Patient being monitored and Timeout performed Patient Re-evaluated:Patient Re-evaluated prior to induction Oxygen Delivery Method: Circle system utilized Preoxygenation: Pre-oxygenation with 100% oxygen Induction Type: IV induction LMA: LMA inserted LMA Size: 4.0 Tube type: Oral Number of attempts: 1 Placement Confirmation: positive ETCO2 and breath sounds checked- equal and bilateral Tube secured with: Tape Dental Injury: Teeth and Oropharynx as per pre-operative assessment

## 2018-11-07 ENCOUNTER — Encounter: Payer: Self-pay | Admitting: Orthopedic Surgery

## 2019-10-03 ENCOUNTER — Other Ambulatory Visit: Payer: Self-pay | Admitting: Pediatrics

## 2019-10-03 DIAGNOSIS — Z1231 Encounter for screening mammogram for malignant neoplasm of breast: Secondary | ICD-10-CM

## 2019-11-06 ENCOUNTER — Ambulatory Visit
Admission: RE | Admit: 2019-11-06 | Discharge: 2019-11-06 | Disposition: A | Discharge status: Home / Self Care | Payer: Managed Care, Other (non HMO) | Source: Ambulatory Visit | Attending: Pediatrics | Admitting: Pediatrics

## 2019-11-06 DIAGNOSIS — Z1231 Encounter for screening mammogram for malignant neoplasm of breast: Secondary | ICD-10-CM | POA: Insufficient documentation

## 2020-10-06 ENCOUNTER — Other Ambulatory Visit: Payer: Self-pay | Admitting: Pediatrics

## 2020-10-06 DIAGNOSIS — Z1231 Encounter for screening mammogram for malignant neoplasm of breast: Secondary | ICD-10-CM

## 2020-11-11 ENCOUNTER — Ambulatory Visit
Admission: RE | Admit: 2020-11-11 | Discharge: 2020-11-11 | Disposition: A | Discharge status: Home / Self Care | Payer: Managed Care, Other (non HMO) | Source: Ambulatory Visit | Attending: Pediatrics | Admitting: Pediatrics

## 2020-11-11 ENCOUNTER — Other Ambulatory Visit: Payer: Self-pay

## 2020-11-11 DIAGNOSIS — Z1231 Encounter for screening mammogram for malignant neoplasm of breast: Secondary | ICD-10-CM | POA: Insufficient documentation

## 2021-04-03 ENCOUNTER — Ambulatory Visit
Admission: EM | Admit: 2021-04-03 | Discharge: 2021-04-03 | Disposition: A | Discharge status: Home / Self Care | Payer: BC Managed Care – PPO | Attending: Emergency Medicine | Admitting: Emergency Medicine

## 2021-04-03 DIAGNOSIS — Z20822 Contact with and (suspected) exposure to covid-19: Secondary | ICD-10-CM | POA: Diagnosis not present

## 2021-04-03 DIAGNOSIS — J069 Acute upper respiratory infection, unspecified: Secondary | ICD-10-CM | POA: Diagnosis not present

## 2021-04-03 MED ORDER — MOLNUPIRAVIR EUA 200MG CAPSULE: 4.0000 | ORAL_CAPSULE | Freq: Two times a day (BID) | ORAL | 0 refills | Status: AC

## 2021-04-03 NOTE — Discharge Instructions (Addendum)
Your COVID and Influenza tests are pending.  You should self quarantine until the test results are back.    If your COVID test is positive, start taking the molnupiravir as directed.  If it is negative, do not take the medication.    Take Tylenol as needed for fever or discomfort.  Rest and keep yourself hydrated.    Follow-up with your primary care provider if your symptoms are not improving.

## 2021-04-03 NOTE — ED Triage Notes (Signed)
Patient presents to Urgent Care with complaints of positive covid exposure. Pt states she is exp nasal congestion, sneezing since last night.  She states her husband has been sick since last Friday. He has quarantined since. She does has a hx of allergies. She is here for testing. Treating with allergy meds and mucinex.   Denies fever.

## 2021-04-03 NOTE — ED Provider Notes (Signed)
Carmen Singleton    CSN: [id number] Arrival date & time: 04/03/21  1410      History   Chief Complaint Chief Complaint  Patient presents with  . Covid Exposure    HPI Carmen Singleton is a 60 y.o. female.   Patient presents with nasal congestion, runny nose, sneezing, fatigue since yesterday evening.  OTC treatment at home.  She denies fever, rash, sore throat, cough, shortness of breath, vomiting, diarrhea, or other symptoms.  Her husband was diagnosed with COVID 1 week ago.  Her medical history includes pulmonary embolism, stroke, cardiac arrest.  The history is provided by the patient and medical records.    Past Medical History:  Diagnosis Date  . GERD (gastroesophageal reflux disease)   . H/O cardiac arrest   . Microscopic colitis   . 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 Pulmonary embolism (HCC)    2014 pt went into cardiac arrest r/t pulmonary emboli  . Rosacea   . Stroke (cerebrum) [facility])     Patient Active Problem List   Diagnosis Date Noted  . Pulmonary embolism (HCC)   . Stroke (cerebrum) (HCC)   . Microscopic colitis   . Rosacea     Past Surgical History:  Procedure Laterality Date  . CANNULATION FOR CARDIOPULMONARY BYPASS     ECMO  . COLONOSCOPY  09/16/2016   tubular adenoma, microscopic colitis  . COLONOSCOPY WITH ESOPHAGOGASTRODUODENOSCOPY (EGD)  2012  . IVC FILTER INSERTION  2014  . KNEE ARTHROSCOPY Left 11/06/2018   Procedure: LEFT ARTHROSCOPY KNEE PARTIAL MEDIAL MENISECTOMY;  Surgeon: Signa Kell, MD;  Location: [facility] ORS;  Service: Orthopedics;  Laterality: Left;  . SHOULDER ARTHROSCOPY WITH LABRAL REPAIR Right 08/21/2018   Procedure: SHOULDER ARTHROSCOPY WITH ANTERIOR LABRAL REPAIR, CAPSULORRAPHY;  Surgeon: Signa Kell, MD;  Location: [facility] ORS;  Service: Orthopedics;  Laterality: Right;  . WRIST SURGERY  left    OB History    Gravida  0   Para  0   Term  0   Preterm  0   AB  0   Living  0     SAB  0   IAB  0   Ectopic  0    Multiple  0   Live Births  0            Home Medications    Prior to Admission medications   Medication Sig Start Date End Date Taking? Authorizing Provider  molnupiravir EUA 200 mg CAPS Take 4 capsules (800 mg total) by mouth 2 (two) times daily for 5 days. 04/03/21 04/08/21 Yes Mickie Bail, NP  budesonide (ENTOCORT EC) 3 MG 24 hr capsule Take 9 mg by mouth daily. 02/06/21   [provider]  carboxymethylcellul-glycerin (REFRESH RELIEVA) 0.5-0.9 % ophthalmic solution Place 1 drop into both eyes at bedtime.    [provider]  Cholecalciferol (VITAMIN D) 50 MCG (2000 UT) tablet Take 2,000 Units by mouth daily.    [provider]  doxycycline (PERIOSTAT) 20 MG tablet Take 20 mg by mouth daily.    [provider]  fexofenadine (ALLEGRA) 180 MG tablet Take 180 mg by mouth daily as needed for allergies or rhinitis.    [provider]  HYDROcodone-acetaminophen (NORCO) 5-325 MG tablet Take 1-2 tablets by mouth every 4 (four) hours as needed for moderate pain or severe pain. 11/06/18   Signa Kell, MD  ivermectin (STROMECTOL) 3 MG TABS tablet Take 21 mg by mouth See admin instructions.  Take 21 mg by mouth once then repeat dose in one week as needed for rosacea 06/20/18   [provider]  Melatonin 5 MG TABS Take 5-10 mg by mouth at bedtime as needed (sleep).    [provider]  metroNIDAZOLE (METROCREAM) 0.75 % cream Apply 1 application topically daily.    [provider]  ondansetron (ZOFRAN ODT) 4 MG disintegrating tablet Take 1 tablet (4 mg total) by mouth every 8 (eight) hours as needed for nausea or vomiting. 11/06/18   Signa Kell, MD  pantoprazole (PROTONIX) 40 MG tablet Take 40 mg by mouth daily.     [provider]  rivaroxaban (XARELTO) 10 MG TABS tablet Take 10 mg by mouth daily.     [provider]    Family History Family History  Problem Relation Age of Onset  . Breast cancer Paternal  Aunt   . Colon cancer Father 50  . Colon cancer Maternal Aunt 50       colon, rectal, stomach   . Colon cancer Maternal Grandfather 50  . Diabetes Mellitus II Mother   . Diabetes Sister   . Heart attack Sister 43  . Juvenile Diabetes Other        two nephews    Social History Social History   Tobacco Use  . Smoking status: Former Games developer  . Smokeless tobacco: Never Used  Vaping Use  . Vaping Use: Never used  Substance Use Topics  . Alcohol use: Yes    Alcohol/week: 2.0 standard drinks    Types: 2 Glasses of wine per week  . Drug use: No     Allergies   5-alpha reductase inhibitors, Other, and Nickel   Review of Systems Review of Systems  Constitutional: Positive for fatigue. Negative for chills and fever.  HENT: Positive for congestion, rhinorrhea and sneezing. Negative for ear pain and sore throat.   Respiratory: Negative for cough and shortness of breath.   Cardiovascular: Negative for chest pain and palpitations.  Gastrointestinal: Negative for abdominal pain, diarrhea and vomiting.  Skin: Negative for color change and rash.  All other systems reviewed and are negative.    Physical Exam Triage Vital Signs ED Triage Vitals  Enc Vitals Group     BP      Pulse      Resp      Temp      Temp src      SpO2      Weight      Height      Head Circumference      Peak Flow      Pain Score      Pain Loc      Pain Edu?      Excl. in GC?    No data found.  Updated Vital Signs BP 115/73 (BP Location: Left Arm)   Pulse 77   Temp 97.7 F (36.5 C) (Temporal)   Resp 16   LMP 04/16/2015   SpO2 97%   Visual Acuity Right Eye Distance:   Left Eye Distance:   Bilateral Distance:    Right Eye Near:   Left Eye Near:    Bilateral Near:     Physical Exam Vitals and nursing note reviewed.  Constitutional:      General: She is not in acute distress.    Appearance: She is well-developed.  HENT:     Head: Normocephalic and atraumatic.     Right Ear:  Tympanic membrane normal.  Left Ear: Tympanic membrane normal.     Nose: Rhinorrhea present.     Mouth/Throat:     Mouth: Mucous membranes are moist.     Pharynx: Oropharynx is clear.  Eyes:     Conjunctiva/sclera: Conjunctivae normal.  Cardiovascular:     Rate and Rhythm: Normal rate and regular rhythm.     Heart sounds: Normal heart sounds.  Pulmonary:     Effort: Pulmonary effort is normal. No respiratory distress.     Breath sounds: Normal breath sounds.  Abdominal:     Palpations: Abdomen is soft.     Tenderness: There is no abdominal tenderness.  Musculoskeletal:     Cervical back: Neck supple.  Skin:    General: Skin is warm and dry.  Neurological:     General: No focal deficit present.     Mental Status: She is alert and oriented to person, place, and time.     Gait: Gait normal.  Psychiatric:        Mood and Affect: Mood normal.        Behavior: Behavior normal.      UC Treatments / Results  Labs (all labs ordered are listed, but only abnormal results are displayed) Labs Reviewed  NOVEL CORONAVIRUS, NAA  COVID-19, FLU A+B NAA    EKG   Radiology No results found.  Procedures Procedures (including critical care time)  Medications Ordered in UC Medications - No data to display  Initial Impression / Assessment and Plan / UC Course  I have reviewed the triage vital signs and the nursing notes.  Pertinent labs & imaging results that were available during my care of the patient were reviewed by me and considered in my medical decision making (see chart for details).   Exposure to COVID, viral URI.  Influenza and COVID pending.  Instructed patient to self quarantine until the test results are back.  Based on patient's medical history and recent significant exposure to COVID, prescribed molnupiravir.  Test results should be back over the weekend.  Instructed patient to start taking the molnupiravir if her COVID test result is positive; instructed her not to  take the medication if the test result is negative.  Discussed symptomatic treatment including Tylenol, rest, hydration.  Instructed patient to follow up with PCP if her symptoms are not improving.  Patient agrees to plan of care.    Final Clinical Impressions(s) / UC Diagnoses   Final diagnoses:  Exposure to COVID-19 virus  Viral URI     Discharge Instructions     Your COVID and Influenza tests are pending.  You should self quarantine until the test results are back.    If your COVID test is positive, start taking the molnupiravir as directed.  If it is negative, do not take the medication.    Take Tylenol as needed for fever or discomfort.  Rest and keep yourself hydrated.    Follow-up with your primary care provider if your symptoms are not improving.        ED Prescriptions    Medication Sig Dispense Auth. Provider   molnupiravir EUA 200 mg CAPS Take 4 capsules (800 mg total) by mouth 2 (two) times daily for 5 days. 40 capsule Mickie Bail, NP     PDMP not reviewed this encounter.   Mickie Bail, NP 04/03/21 [phone]

## 2021-04-04 LAB — SARS-COV-2, NAA 2 DAY TAT

## 2021-04-04 LAB — NOVEL CORONAVIRUS, NAA: SARS-CoV-2, NAA: NOT DETECTED

## 2021-04-04 LAB — COVID-19, FLU A+B NAA

## 2021-04-06 LAB — COVID-19, FLU A+B NAA

## 2021-04-06 LAB — SPECIMEN STATUS REPORT

## 2021-06-02 ENCOUNTER — Emergency Department: Payer: BC Managed Care – PPO

## 2021-06-02 ENCOUNTER — Other Ambulatory Visit: Payer: Self-pay

## 2021-06-02 ENCOUNTER — Emergency Department
Admission: EM | Admit: 2021-06-02 | Discharge: 2021-06-02 | Disposition: A | Discharge status: Home / Self Care | Payer: BC Managed Care – PPO | Attending: Emergency Medicine | Admitting: Emergency Medicine

## 2021-06-02 DIAGNOSIS — W19XXXA Unspecified fall, initial encounter: Secondary | ICD-10-CM

## 2021-06-02 DIAGNOSIS — W101XXA Fall (on)(from) sidewalk curb, initial encounter: Secondary | ICD-10-CM | POA: Diagnosis not present

## 2021-06-02 DIAGNOSIS — S62524A Nondisplaced fracture of distal phalanx of right thumb, initial encounter for closed fracture: Secondary | ICD-10-CM | POA: Diagnosis not present

## 2021-06-02 DIAGNOSIS — Z7901 Long term (current) use of anticoagulants: Secondary | ICD-10-CM | POA: Insufficient documentation

## 2021-06-02 DIAGNOSIS — M25571 Pain in right ankle and joints of right foot: Secondary | ICD-10-CM | POA: Insufficient documentation

## 2021-06-02 DIAGNOSIS — Y92009 Unspecified place in unspecified non-institutional (private) residence as the place of occurrence of the external cause: Secondary | ICD-10-CM | POA: Insufficient documentation

## 2021-06-02 DIAGNOSIS — Z87891 Personal history of nicotine dependence: Secondary | ICD-10-CM | POA: Diagnosis not present

## 2021-06-02 DIAGNOSIS — S6991XA Unspecified injury of right wrist, hand and finger(s), initial encounter: Secondary | ICD-10-CM | POA: Diagnosis present

## 2021-06-02 DIAGNOSIS — S82831A Other fracture of upper and lower end of right fibula, initial encounter for closed fracture: Secondary | ICD-10-CM

## 2021-06-02 DIAGNOSIS — S8261XA Displaced fracture of lateral malleolus of right fibula, initial encounter for closed fracture: Secondary | ICD-10-CM | POA: Diagnosis not present

## 2021-06-02 MED ORDER — HYDROCODONE-ACETAMINOPHEN 5-325 MG PO TABS: 1.0000 | ORAL_TABLET | Freq: Four times a day (QID) | ORAL | 0 refills | Status: AC | PRN

## 2021-06-02 NOTE — Discharge Instructions (Addendum)
Call make an appointment with Dr. Martha Clan who is the orthopedist on-call.  His office is in [place] and phone number along with address is listed on your discharge papers.  Ice and elevation both to your thumb and ankle.  No weightbearing on your ankle to prevent further injury.  A prescription for pain medication was sent to the pharmacy.  Be aware that this medication could cause drowsiness and increase your risk for injury.  You may also take ibuprofen if additional pain medication is needed.  Wear OCL splint on your ankle and thumb until you are seen by the orthopedist.

## 2021-06-02 NOTE — ED Notes (Signed)
See triage note  Presents with pain to right thumb and ankle area  States she missed a step yesterday  Abrasion noted to left knee   Bruising noted to right thumb with swelling   Tenderness to right ankle  Good pulses

## 2021-06-02 NOTE — ED Triage Notes (Signed)
Pt here with a fall yesterday morning. Pt fell and hurt her right thumb and right ankle with abrasions noted on her left knee. Pt stable in triage.

## 2021-06-02 NOTE — ED Provider Notes (Signed)
[facility] Emergency Department Provider Note   ____________________________________________   Event Date/Time   First MD Initiated Contact with Patient 06/02/21 1026     (approximate)  I have reviewed the triage vital signs and the nursing notes.   HISTORY  Chief Complaint Fall    HPI Carmen Singleton is a 60 y.o. female presents to the ED after falling on her sidewalk near her home yesterday and patient states that she looked down at her watch and missed a step on the sidewalk causing her to fall approximately 1 inch injuring her right ankle and right thumb.  She denies any head injury or loss of consciousness.  Patient is continue to ambulate however her husband had a knee scooter at home and she is been using it due to ankle pain.  Currently she rates her pain as an 8 out of 10.       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   Pulmonary embolism (HCC)    2014 pt went into cardiac arrest r/t pulmonary emboli   Rosacea    Stroke (cerebrum) [facility])     Patient Active Problem List   Diagnosis Date Noted   Pulmonary embolism (HCC)    Stroke (cerebrum) (HCC)    Microscopic colitis    Rosacea     Past Surgical History:  Procedure Laterality Date   CANNULATION FOR CARDIOPULMONARY BYPASS     ECMO   COLONOSCOPY  09/16/2016   tubular adenoma, microscopic colitis   COLONOSCOPY WITH ESOPHAGOGASTRODUODENOSCOPY (EGD)  2012   IVC FILTER INSERTION  2014   KNEE ARTHROSCOPY Left 11/06/2018   Procedure: LEFT ARTHROSCOPY KNEE PARTIAL MEDIAL MENISECTOMY;  Surgeon: Signa Kell, MD;  Location: [facility] ORS;  Service: Orthopedics;  Laterality: Left;   SHOULDER ARTHROSCOPY WITH LABRAL REPAIR Right 08/21/2018   Procedure: SHOULDER ARTHROSCOPY WITH ANTERIOR LABRAL REPAIR, CAPSULORRAPHY;  Surgeon: Signa Kell, MD;  Location: [facility] ORS;  Service:  Orthopedics;  Laterality: Right;   WRIST SURGERY  left    Prior to Admission medications   Medication Sig Start Date End Date Taking? Authorizing Provider  HYDROcodone-acetaminophen (NORCO/VICODIN) 5-325 MG tablet Take 1 tablet by mouth every 6 (six) hours as needed for moderate pain. 06/02/21 06/02/22 Yes Keaten Mashek L, PA-C  budesonide (ENTOCORT EC) 3 MG 24 hr capsule Take 9 mg by mouth daily. 02/06/21   [provider]  carboxymethylcellul-glycerin (REFRESH RELIEVA) 0.5-0.9 % ophthalmic solution Place 1 drop into both eyes at bedtime.    [provider]  Cholecalciferol (VITAMIN D) 50 MCG (2000 UT) tablet Take 2,000 Units by mouth daily.    [provider]  doxycycline (PERIOSTAT) 20 MG tablet Take 20 mg by mouth daily.    [provider]  fexofenadine (ALLEGRA) 180 MG tablet Take 180 mg by mouth daily as needed for allergies or rhinitis.    [provider]  ivermectin (STROMECTOL) 3 MG TABS tablet Take 21 mg by mouth See admin instructions. Take 21 mg by mouth once then repeat dose in one week as needed for rosacea 06/20/18   [provider]  Melatonin 5 MG TABS Take 5-10 mg by mouth at bedtime as needed (sleep).    [provider]  metroNIDAZOLE (METROCREAM) 0.75 % cream Apply 1 application topically daily.    [provider]  ondansetron (ZOFRAN ODT)  4 MG disintegrating tablet Take 1 tablet (4 mg total) by mouth every 8 (eight) hours as needed for nausea or vomiting. 11/06/18   Signa KellPatel, Sunny, MD  pantoprazole (PROTONIX) 40 MG tablet Take 40 mg by mouth daily.     [provider]  rivaroxaban (XARELTO) 10 MG TABS tablet Take 10 mg by mouth daily.     [provider]    Allergies 5-alpha reductase inhibitors, Other, and Nickel  Family History  Problem Relation Age of Onset   Breast cancer Paternal Aunt    Colon cancer Father [id number]   Colon cancer Maternal Aunt 50       colon, rectal, stomach    Colon  cancer Maternal Grandfather 50   Diabetes Mellitus II Mother    Diabetes Sister    Heart attack Sister 1643   Juvenile Diabetes Other        two nephews    Social History Social History   Tobacco Use   Smoking status: Former   Smokeless tobacco: Never  Building services engineerVaping Use   Vaping Use: Never used  Substance Use Topics   Alcohol use: Yes    Alcohol/week: 2.0 standard drinks    Types: 2 Glasses of wine per week   Drug use: No    Review of Systems Constitutional: No fever/chills Eyes: No visual changes. ENT: No trauma. Cardiovascular: Denies chest pain. Respiratory: Denies shortness of breath. Gastrointestinal: No abdominal pain.  No nausea, no vomiting.   Musculoskeletal: Positive right thumb and right ankle pain. Skin: Bruising to right ankle and right thumb. Neurological: Negative for headaches, focal weakness or numbness. ____________________________________________   PHYSICAL EXAM:  VITAL SIGNS: ED Triage Vitals  Enc Vitals Group     BP 06/02/21 1017 (!) 124/93     Pulse Rate 06/02/21 1017 76     Resp 06/02/21 1017 16     Temp 06/02/21 1017 98.7 F (37.1 C)     Temp Source 06/02/21 1017 Oral     SpO2 06/02/21 1017 98 %     Weight 06/02/21 1018 175 lb (79.4 kg)     Height 06/02/21 1018 5\' 6"  (1.676 m)     Head Circumference --      Peak Flow --      Pain Score 06/02/21 1017 8     Pain Loc --      Pain Edu? --      Excl. in GC? --     Constitutional: Alert and oriented. Well appearing and in no acute distress. Eyes: Conjunctivae are normal. PERRL. EOMI. Head: Atraumatic. Nose: No trauma. Neck: No stridor.  No cervical tenderness on palpation posteriorly. Cardiovascular: Normal rate, regular rhythm. Grossly normal heart sounds.  Good peripheral circulation. Respiratory: Normal respiratory effort.  No retractions. Lungs CTAB. Gastrointestinal: Soft and nontender. No distention.  Musculoskeletal: Tenderness is noted on palpation of the thoracic or lumbar spine.   Right thumb is ecchymotic with soft tissue edema.  No gross deformity but markedly tender to palpation.  Skin is intact.  Examination of the right ankle shows moderate soft tissue edema on the lateral aspect without gross deformity.  Marked tenderness on palpation in this area.  There is an ecchymotic area on the lateral distal aspect suggestive of a fracture of the fibula.  Motor sensory function and capillary refill distal to the injury is intact.  Skin is intact. Neurologic:  Normal speech and language. No gross focal neurologic deficits are appreciated.  Skin:  Skin is warm, dry and  intact. No rash noted. Psychiatric: Mood and affect are normal. Speech and behavior are normal.  ____________________________________________   LABS (all labs ordered are listed, but only abnormal results are displayed)  Labs Reviewed - No data to display ____________________________________________  ____________________________________________  RADIOLOGY Beaulah Corin, personally viewed and evaluated these images (plain radiographs) as part of my medical decision making, as well as reviewing the written report by the radiologist.  ED MD interpretation: Fracture noted to the right thumb and right ankle/distal fibula.  Official radiology report(s): DG Ankle Complete Right  Result Date: 06/02/2021 CLINICAL DATA:  Right ankle pain after fall yesterday. EXAM: RIGHT ANKLE - COMPLETE 3+ VIEW COMPARISON:  None. FINDINGS: Acute transverse fracture of the lateral malleolus with 3 mm lateral displacement. No additional fracture. No dislocation. The ankle mortise is symmetric. The talar dome is intact. Joint spaces are preserved. Lateral hindfoot soft tissue swelling. IMPRESSION: 1. Acute minimally displaced lateral malleolar fracture. Electronically Signed   By: Obie Dredge M.D.   On: 06/02/2021 11:10   DG Finger Thumb Right  Result Date: 06/02/2021 CLINICAL DATA:  Fall.  Thumb injury. EXAM: RIGHT THUMB 2+V  COMPARISON:  None. FINDINGS: Transverse fracture noted in the proximal shaft of the distal phalanx without substantial distraction or angulation. No other acute bony abnormality evident. IMPRESSION: Nondisplaced acute transverse fracture of the distal phalanx. Electronically Signed   By: Kennith Center M.D.   On: 06/02/2021 11:09    ____________________________________________   PROCEDURES  Procedure(s) performed (including Critical Care):  Procedures   ____________________________________________   INITIAL IMPRESSION / ASSESSMENT AND PLAN / ED COURSE  As part of my medical decision making, I reviewed the following data within the electronic MEDICAL RECORD NUMBER Notes from prior ED visits and [place] Controlled Substance Database  60 year old female presents to the ED after a fall near her home on the sidewalk yesterday.  Patient was with her husband and states that there was no loss of consciousness.  She has continued to have pain in her right thumb and right ankle since her fall.  Physical exam is suggestive of a fracture and x-rays showed that patient does have a closed fracture of her right thumb and distal portion of her right fibula.  Patient was placed in a thumb spica splint and a OCL stirrup splint.  Patient is encouraged to ice and elevate to reduce swelling.  She already has a scooter that belongs to her husband at home that she has been using and does not need crutches.  A prescription for hydrocodone was sent to her pharmacy and she is aware that this may cause drowsiness and increase her risk for falling again.  She is to follow-up with Dr. Martha Clan.  She is return to the emergency department if any urgent concerns prior to her orthopedic appointment.   ____________________________________________   FINAL CLINICAL IMPRESSION(S) / ED DIAGNOSES  Final diagnoses:  Closed fracture of distal end of right fibula, unspecified fracture morphology, initial encounter  Closed nondisplaced  fracture of distal phalanx of right thumb, initial encounter  Fall, initial encounter     ED Discharge Orders          Ordered    HYDROcodone-acetaminophen (NORCO/VICODIN) 5-325 MG tablet  Every 6 hours PRN        06/02/21 1243             Note:  This document was prepared using Dragon voice recognition software and may include unintentional dictation errors.    Levada Schilling,  Kallie Locks, PA-C 06/02/21 1626    Sharyn Creamer, MD 06/06/21 [phone]

## 2021-08-28 ENCOUNTER — Other Ambulatory Visit: Payer: Self-pay | Admitting: Certified Nurse Midwife

## 2021-08-28 DIAGNOSIS — Z1231 Encounter for screening mammogram for malignant neoplasm of breast: Secondary | ICD-10-CM

## 2021-11-09 ENCOUNTER — Other Ambulatory Visit: Payer: Self-pay

## 2021-11-09 ENCOUNTER — Encounter: Payer: Self-pay | Admitting: Emergency Medicine

## 2021-11-09 ENCOUNTER — Ambulatory Visit
Admission: EM | Admit: 2021-11-09 | Discharge: 2021-11-09 | Disposition: A | Discharge status: Home / Self Care | Payer: BC Managed Care – PPO | Attending: Emergency Medicine | Admitting: Emergency Medicine

## 2021-11-09 DIAGNOSIS — J0110 Acute frontal sinusitis, unspecified: Secondary | ICD-10-CM | POA: Diagnosis not present

## 2021-11-09 MED ORDER — AMOXICILLIN 875 MG PO TABS: 875.0000 mg | ORAL_TABLET | Freq: Two times a day (BID) | ORAL | 0 refills | Status: AC

## 2021-11-09 MED ORDER — BENZONATATE 100 MG PO CAPS: 100.0000 mg | ORAL_CAPSULE | Freq: Three times a day (TID) | ORAL | 0 refills | Status: AC | PRN

## 2021-11-09 NOTE — Discharge Instructions (Addendum)
Take the amoxicillin and Tessalon Perles as directed.    Follow up with your primary care provider if your symptoms are not improving.    

## 2021-11-09 NOTE — ED Triage Notes (Signed)
Pt c/o cough, HA, ST x 1 week. Patient denies fever and AT home covid test negative.

## 2021-11-09 NOTE — ED Provider Notes (Signed)
Roderic Palau    CSN: [id number] Arrival date & time: 11/09/21  [id number]      History   Chief Complaint Chief Complaint  Patient presents with   Cough   Sore Throat    HPI Carmen Singleton is a 60 y.o. female.  Patient presents with >1 week history of sinus pressure, congestion, sore throat, headache, cough.  No fever, rash, shortness of breath, or other symptoms.  Treatment at home with OTC cough and cold medication.  Her medical history includes CKD, history of cardiac arrest, pulmonary embolism, stroke, neuropathy, GERD.   The history is provided by the patient and medical records.   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   Pulmonary embolism ([place])    2014 pt went into cardiac arrest r/t pulmonary emboli   Rosacea    Stroke (cerebrum) [facility])     Patient Active Problem List   Diagnosis Date Noted   Pulmonary embolism ([place])    Stroke (cerebrum) ([place])    Microscopic colitis    Rosacea     Past Surgical History:  Procedure Laterality Date   CANNULATION FOR CARDIOPULMONARY BYPASS     ECMO   COLONOSCOPY  09/16/2016   tubular adenoma, microscopic colitis   COLONOSCOPY WITH ESOPHAGOGASTRODUODENOSCOPY (EGD)  2012   IVC FILTER INSERTION  2014   KNEE ARTHROSCOPY Left 11/06/2018   Procedure: LEFT ARTHROSCOPY KNEE PARTIAL MEDIAL MENISECTOMY;  Surgeon: Leim Fabry, MD;  Location: [facility] ORS;  Service: Orthopedics;  Laterality: Left;   SHOULDER ARTHROSCOPY WITH LABRAL REPAIR Right 08/21/2018   Procedure: SHOULDER ARTHROSCOPY WITH ANTERIOR LABRAL REPAIR, CAPSULORRAPHY;  Surgeon: Leim Fabry, MD;  Location: [facility] ORS;  Service: Orthopedics;  Laterality: Right;   WRIST SURGERY  left    OB History     Gravida  0   Para  0   Term  0   Preterm  0   AB  0   Living  0      SAB  0   IAB  0   Ectopic  0   Multiple  0   Live Births  0             Home Medications    Prior to Admission medications   Medication Sig Start Date End Date Taking? Authorizing Provider  amoxicillin (AMOXIL) 875 MG tablet Take 1 tablet (875 mg total) by mouth 2 (two) times daily for 10 days. 11/09/21 11/19/21 Yes Sharion Balloon, NP  benzonatate (TESSALON) 100 MG capsule Take 1 capsule (100 mg total) by mouth 3 (three) times daily as needed for cough. 11/09/21  Yes Sharion Balloon, NP  budesonide (ENTOCORT EC) 3 MG 24 hr capsule Take 9 mg by mouth daily. 02/06/21   [provider]  carboxymethylcellul-glycerin (REFRESH RELIEVA) 0.5-0.9 % ophthalmic solution Place 1 drop into both eyes at bedtime.    [provider]  Cholecalciferol (VITAMIN D) 50 MCG (2000 UT) tablet Take 2,000 Units by mouth daily.    [provider]  fexofenadine (ALLEGRA) 180 MG tablet Take 180 mg by mouth daily as needed for allergies or rhinitis.    [provider]  HYDROcodone-acetaminophen (NORCO/VICODIN) 5-325 MG tablet Take 1 tablet by mouth every 6 (six) hours as needed for moderate pain. 06/02/21 06/02/22  Johnn Hai, PA-C  ivermectin (STROMECTOL) 3 MG TABS  tablet Take 21 mg by mouth See admin instructions. Take 21 mg by mouth once then repeat dose in one week as needed for rosacea 06/20/18   [provider]  Melatonin 5 MG TABS Take 5-10 mg by mouth at bedtime as needed (sleep).    [provider]  metroNIDAZOLE (METROCREAM) 0.75 % cream Apply 1 application topically daily.    [provider]  ondansetron (ZOFRAN ODT) 4 MG disintegrating tablet Take 1 tablet (4 mg total) by mouth every 8 (eight) hours as needed for nausea or vomiting. 11/06/18   Leim Fabry, MD  pantoprazole (PROTONIX) 40 MG tablet Take 40 mg by mouth daily.     [provider]  rivaroxaban (XARELTO) 10 MG TABS tablet Take 10 mg by mouth daily.     [provider]    Family History Family History  Problem Relation Age of Onset    Breast cancer Paternal 73    Colon cancer Father 28   Colon cancer Maternal Aunt 47       colon, rectal, stomach    Colon cancer Maternal Grandfather 86   Diabetes Mellitus II Mother    Diabetes Sister    Heart attack Sister 56   Juvenile Diabetes Other        two nephews    Social History Social History   Tobacco Use   Smoking status: Former   Smokeless tobacco: Never  Scientific laboratory technician Use: Never used  Substance Use Topics   Alcohol use: Yes    Alcohol/week: 2.0 standard drinks    Types: 2 Glasses of wine per week   Drug use: No     Allergies   5-alpha reductase inhibitors, Other, and Nickel   Review of Systems Review of Systems  Constitutional:  Negative for chills and fever.  HENT:  Positive for congestion, sinus pressure and sore throat. Negative for ear pain.   Respiratory:  Positive for cough. Negative for shortness of breath.   Cardiovascular:  Negative for chest pain and palpitations.  Gastrointestinal:  Negative for diarrhea and vomiting.  Skin:  Negative for color change and rash.  Neurological:  Positive for headaches. Negative for syncope.  All other systems reviewed and are negative.   Physical Exam Triage Vital Signs ED Triage Vitals  Enc Vitals Group     BP 11/09/21 1053 129/80     Pulse Rate 11/09/21 1053 (!) 58     Resp --      Temp 11/09/21 1053 97.8 F (36.6 C)     Temp Source 11/09/21 1053 Oral     SpO2 11/09/21 1053 97 %     Weight --      Height --      Head Circumference --      Peak Flow --      Pain Score 11/09/21 1056 0     Pain Loc --      Pain Edu? --      Excl. in [place]? --    No data found.  Updated Vital Signs BP 129/80 (BP Location: Left Arm)    Pulse (!) 58    Temp 97.8 F (36.6 C) (Oral)    LMP 04/16/2015    SpO2 97%   Visual Acuity Right Eye Distance:   Left Eye Distance:   Bilateral Distance:    Right Eye Near:   Left Eye Near:    Bilateral Near:     Physical Exam Vitals and nursing note reviewed.  Constitutional:      General: She is not in acute distress.    Appearance: Normal appearance. She is well-developed.  HENT:     Right Ear: Tympanic membrane normal.     Left Ear: Tympanic membrane normal.     Nose: Congestion present.     Mouth/Throat:     Mouth: Mucous membranes are moist.     Pharynx: Oropharynx is clear.  Cardiovascular:     Rate and Rhythm: Normal rate and regular rhythm.     Heart sounds: Normal heart sounds.  Pulmonary:     Effort: Pulmonary effort is normal. No respiratory distress.     Breath sounds: Normal breath sounds.  Musculoskeletal:     Cervical back: Neck supple.  Skin:    General: Skin is warm and dry.  Neurological:     Mental Status: She is alert.  Psychiatric:        Mood and Affect: Mood normal.        Behavior: Behavior normal.     UC Treatments / Results  Labs (all labs ordered are listed, but only abnormal results are displayed) Labs Reviewed - No data to display  EKG   Radiology No results found.  Procedures Procedures (including critical care time)  Medications Ordered in UC Medications - No data to display  Initial Impression / Assessment and Plan / UC Course  I have reviewed the triage vital signs and the nursing notes.  Pertinent labs & imaging results that were available during my care of the patient were reviewed by me and considered in my medical decision making (see chart for details).   Acute sinusitis.  Patient has been symptomatic for more than 1 week and is not improving with OTC treatment.  Treating with amoxicillin and Tessalon Perles.  Education provided on sinusitis.  Instructed patient to follow-up with her PCP if her symptoms are not improving.  Patient agrees to plan of care.   Final Clinical Impressions(s) / UC Diagnoses   Final diagnoses:  Acute non-recurrent frontal sinusitis     Discharge Instructions      Take the amoxicillin and Tessalon Perles as directed.  Follow up with your primary  care provider if your symptoms are not improving.         ED Prescriptions     Medication Sig Dispense Auth. Provider   amoxicillin (AMOXIL) 875 MG tablet Take 1 tablet (875 mg total) by mouth 2 (two) times daily for 10 days. 20 tablet Mickie Bail, NP   benzonatate (TESSALON) 100 MG capsule Take 1 capsule (100 mg total) by mouth 3 (three) times daily as needed for cough. 21 capsule Mickie Bail, NP      PDMP not reviewed this encounter.   Mickie Bail, NP 11/09/21 1149

## 2021-11-12 ENCOUNTER — Other Ambulatory Visit: Payer: Self-pay

## 2021-11-12 ENCOUNTER — Ambulatory Visit
Admission: RE | Admit: 2021-11-12 | Discharge: 2021-11-12 | Disposition: A | Discharge status: Home / Self Care | Payer: BC Managed Care – PPO | Source: Ambulatory Visit | Attending: Certified Nurse Midwife | Admitting: Certified Nurse Midwife

## 2021-11-12 DIAGNOSIS — Z1231 Encounter for screening mammogram for malignant neoplasm of breast: Secondary | ICD-10-CM | POA: Diagnosis present

## 2021-12-22 ENCOUNTER — Other Ambulatory Visit: Payer: Self-pay

## 2021-12-22 ENCOUNTER — Ambulatory Visit
Admission: RE | Admit: 2021-12-22 | Discharge: 2021-12-22 | Disposition: A | Discharge status: Home / Self Care | Payer: BC Managed Care – PPO | Source: Ambulatory Visit | Attending: Emergency Medicine | Admitting: Emergency Medicine

## 2021-12-22 VITALS — BP 107/73 | HR 103 | Temp 98.1°F | Resp 18

## 2021-12-22 DIAGNOSIS — B349 Viral infection, unspecified: Secondary | ICD-10-CM | POA: Diagnosis not present

## 2021-12-22 LAB — POCT RAPID STREP A (OFFICE): Rapid Strep A Screen: NEGATIVE

## 2021-12-22 MED ORDER — LIDOCAINE VISCOUS HCL 2 % MT SOLN: 15.0000 mL | OROMUCOSAL | 0 refills | Status: AC | PRN

## 2021-12-22 NOTE — ED Provider Notes (Signed)
Roderic Palau    CSN: [id number] Arrival date & time: 12/22/21  1217      History   Chief Complaint Chief Complaint  Patient presents with   Headache   Otalgia   Sore Throat   Nasal Congestion    HPI Carmen Singleton is a 61 y.o. female.  Patient presents with 3-day history of sore throat, ear pain, congestion, mild occasional nonproductive cough, headache.  Treatment at home with Mucinex.  She denies fever, chills, rash, shortness of breath, chest pain medical history includes pulmonary embolism and stroke.  The history is provided by the patient and medical records.   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   Pulmonary embolism ([place])    2014 pt went into cardiac arrest r/t pulmonary emboli   Rosacea    Stroke (cerebrum) [facility])     Patient Active Problem List   Diagnosis Date Noted   Pulmonary embolism ([place])    Stroke (cerebrum) ([place])    Microscopic colitis    Rosacea     Past Surgical History:  Procedure Laterality Date   CANNULATION FOR CARDIOPULMONARY BYPASS     ECMO   COLONOSCOPY  09/16/2016   tubular adenoma, microscopic colitis   COLONOSCOPY WITH ESOPHAGOGASTRODUODENOSCOPY (EGD)  2012   IVC FILTER INSERTION  2014   KNEE ARTHROSCOPY Left 11/06/2018   Procedure: LEFT ARTHROSCOPY KNEE PARTIAL MEDIAL MENISECTOMY;  Surgeon: Leim Fabry, MD;  Location: [facility] ORS;  Service: Orthopedics;  Laterality: Left;   SHOULDER ARTHROSCOPY WITH LABRAL REPAIR Right 08/21/2018   Procedure: SHOULDER ARTHROSCOPY WITH ANTERIOR LABRAL REPAIR, CAPSULORRAPHY;  Surgeon: Leim Fabry, MD;  Location: [facility] ORS;  Service: Orthopedics;  Laterality: Right;   WRIST SURGERY  left    OB History     Gravida  0   Para  0   Term  0   Preterm  0   AB  0   Living  0      SAB  0   IAB  0   Ectopic  0   Multiple  0   Live Births  0             Home Medications    Prior to Admission medications   Medication Sig Start Date End Date Taking? Authorizing Provider  lidocaine (XYLOCAINE) 2 % solution Use as directed 15 mLs in the mouth or throat as needed for mouth pain. 12/22/21  Yes Sharion Balloon, NP  benzonatate (TESSALON) 100 MG capsule Take 1 capsule (100 mg total) by mouth 3 (three) times daily as needed for cough. 11/09/21   Sharion Balloon, NP  budesonide (ENTOCORT EC) 3 MG 24 hr capsule Take 9 mg by mouth daily. 02/06/21   [provider]  carboxymethylcellul-glycerin (REFRESH RELIEVA) 0.5-0.9 % ophthalmic solution Place 1 drop into both eyes at bedtime.    [provider]  Cholecalciferol (VITAMIN D) 50 MCG (2000 UT) tablet Take 2,000 Units by mouth daily.    [provider]  fexofenadine (ALLEGRA) 180 MG tablet Take 180 mg by mouth daily as needed for allergies or rhinitis.    [provider]  HYDROcodone-acetaminophen (NORCO/VICODIN) 5-325 MG tablet Take 1 tablet by mouth every 6 (six) hours as needed for moderate pain. 06/02/21 06/02/22  Johnn Hai, PA-C  ivermectin (STROMECTOL) 3 MG TABS tablet Take 21  mg by mouth See admin instructions. Take 21 mg by mouth once then repeat dose in one week as needed for rosacea 06/20/18   [provider]  Melatonin 5 MG TABS Take 5-10 mg by mouth at bedtime as needed (sleep).    [provider]  metroNIDAZOLE (METROCREAM) 0.75 % cream Apply 1 application topically daily.    [provider]  ondansetron (ZOFRAN ODT) 4 MG disintegrating tablet Take 1 tablet (4 mg total) by mouth every 8 (eight) hours as needed for nausea or vomiting. 11/06/18   Leim Fabry, MD  pantoprazole (PROTONIX) 40 MG tablet Take 40 mg by mouth daily.     [provider]  rivaroxaban (XARELTO) 10 MG TABS tablet Take 10 mg by mouth daily.     [provider]    Family History Family History  Problem Relation Age of Onset   Breast cancer  Paternal 71    Colon cancer Father 82   Colon cancer Maternal Aunt 62       colon, rectal, stomach    Colon cancer Maternal Grandfather 75   Diabetes Mellitus II Mother    Diabetes Sister    Heart attack Sister 32   Juvenile Diabetes Other        two nephews    Social History Social History   Tobacco Use   Smoking status: Former   Smokeless tobacco: Never  Scientific laboratory technician Use: Never used  Substance Use Topics   Alcohol use: Yes    Alcohol/week: 2.0 standard drinks    Types: 2 Glasses of wine per week   Drug use: No     Allergies   5-alpha reductase inhibitors, Other, and Nickel   Review of Systems Review of Systems  Constitutional:  Negative for chills and fever.  HENT:  Positive for congestion, ear pain and sore throat.   Respiratory:  Positive for cough. Negative for shortness of breath.   Cardiovascular:  Negative for chest pain and palpitations.  Gastrointestinal:  Negative for diarrhea and vomiting.  Skin:  Negative for color change and rash.  All other systems reviewed and are negative.   Physical Exam Triage Vital Signs ED Triage Vitals  Enc Vitals Group     BP      Pulse      Resp      Temp      Temp src      SpO2      Weight      Height      Head Circumference      Peak Flow      Pain Score      Pain Loc      Pain Edu?      Excl. in [place]?    No data found.  Updated Vital Signs BP 107/73    Pulse (!) 103    Temp 98.1 F (36.7 C)    Resp 18    LMP 04/16/2015    SpO2 97%   Visual Acuity Right Eye Distance:   Left Eye Distance:   Bilateral Distance:    Right Eye Near:   Left Eye Near:    Bilateral Near:     Physical Exam Vitals and nursing note reviewed.  Constitutional:      General: She is not in acute distress.    Appearance: Normal appearance. She is well-developed. She is not ill-appearing.  HENT:     Right Ear: Tympanic membrane normal.     Left  Ear: Tympanic membrane normal.     Nose: Nose normal.     Mouth/Throat:      Mouth: Mucous membranes are moist.     Pharynx: Posterior oropharyngeal erythema present.  Cardiovascular:     Rate and Rhythm: Normal rate and regular rhythm.     Heart sounds: Normal heart sounds.  Pulmonary:     Effort: Pulmonary effort is normal. No respiratory distress.     Breath sounds: Normal breath sounds.  Musculoskeletal:     Cervical back: Neck supple.  Skin:    General: Skin is warm and dry.  Neurological:     Mental Status: She is alert.  Psychiatric:        Mood and Affect: Mood normal.        Behavior: Behavior normal.     UC Treatments / Results  Labs (all labs ordered are listed, but only abnormal results are displayed) Labs Reviewed  POCT RAPID STREP A (OFFICE) - Normal  COVID-19, FLU A+B NAA    EKG   Radiology No results found.  Procedures Procedures (including critical care time)  Medications Ordered in UC Medications - No data to display  Initial Impression / Assessment and Plan / UC Course  I have reviewed the triage vital signs and the nursing notes.  Pertinent labs & imaging results that were available during my care of the patient were reviewed by me and considered in my medical decision making (see chart for details).    Viral illness.  Rapid strep negative.  COVID and flu pending.  Instructed patient to self quarantine per CDC guidelines.  Treating sore throat with viscous lidocaine.  Discussed symptomatic treatment including Tylenol or ibuprofen, rest, hydration.  Instructed patient to follow up with PCP if symptoms are not improving.  Patient agrees to plan of care.   Final Clinical Impressions(s) / UC Diagnoses   Final diagnoses:  Viral illness     Discharge Instructions      Your strep test is negative.  Your COVID and Flu tests are pending.  You should self quarantine until the test results are back.    Use the viscous lidocaine as directed.  Take Tylenol or ibuprofen as needed for fever or discomfort.  Rest and keep  yourself hydrated.    Follow-up with your primary care provider if your symptoms are not improving.         ED Prescriptions     Medication Sig Dispense Auth. Provider   lidocaine (XYLOCAINE) 2 % solution Use as directed 15 mLs in the mouth or throat as needed for mouth pain. 100 mL Sharion Balloon, NP      PDMP not reviewed this encounter.   Sharion Balloon, NP 12/22/21 [phone]

## 2021-12-22 NOTE — ED Triage Notes (Signed)
Pt here with headache, otalgia, sore throat and nasal congestion x 3 days.

## 2021-12-22 NOTE — Discharge Instructions (Addendum)
Your strep test is negative.  Your COVID and Flu tests are pending.  You should self quarantine until the test results are back.   ° °Use the viscous lidocaine as directed.  Take Tylenol or ibuprofen as needed for fever or discomfort.  Rest and keep yourself hydrated.   ° °Follow-up with your primary care provider if your symptoms are not improving.   ° ° °

## 2021-12-24 LAB — COVID-19, FLU A+B NAA
Influenza A, NAA: NOT DETECTED
Influenza B, NAA: NOT DETECTED
SARS-CoV-2, NAA: NOT DETECTED

## 2023-12-10 IMAGING — MG MM DIGITAL SCREENING BILAT W/ TOMO AND CAD
8 series · 8 of 24 positions shown · non-contrast
Comparison: Previous exam(s).

CLINICAL DATA: Screening.

EXAM:
DIGITAL SCREENING BILATERAL MAMMOGRAM WITH TOMOSYNTHESIS AND CAD
TECHNIQUE: Bilateral screening digital craniocaudal and mediolateral oblique
mammograms were obtained. Bilateral screening digital breast
tomosynthesis was performed. The images were evaluated with
computer-aided detection.

[R CC synth-2D]
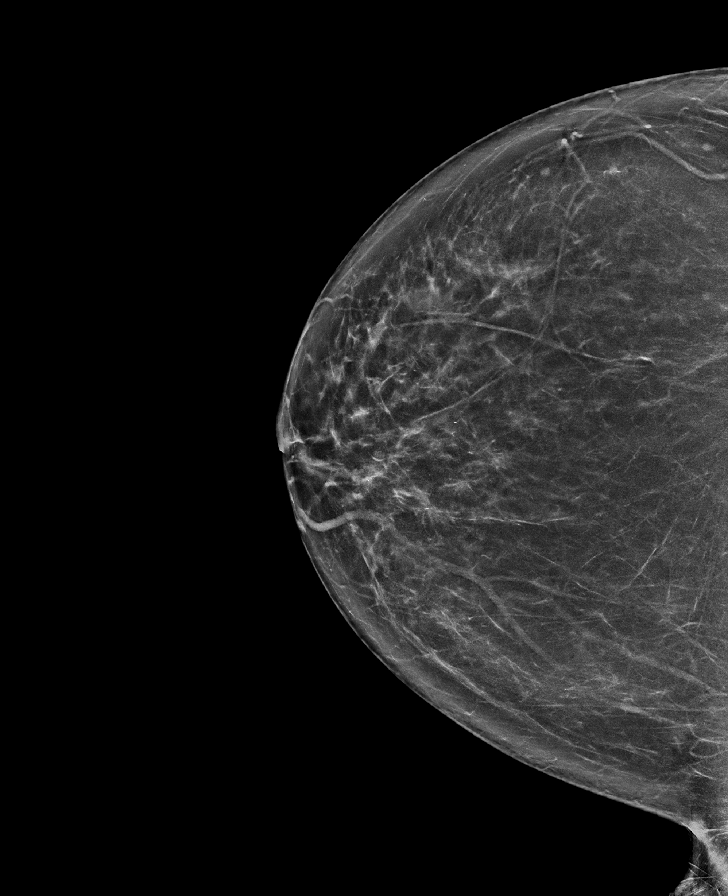

[R MLO synth-2D]
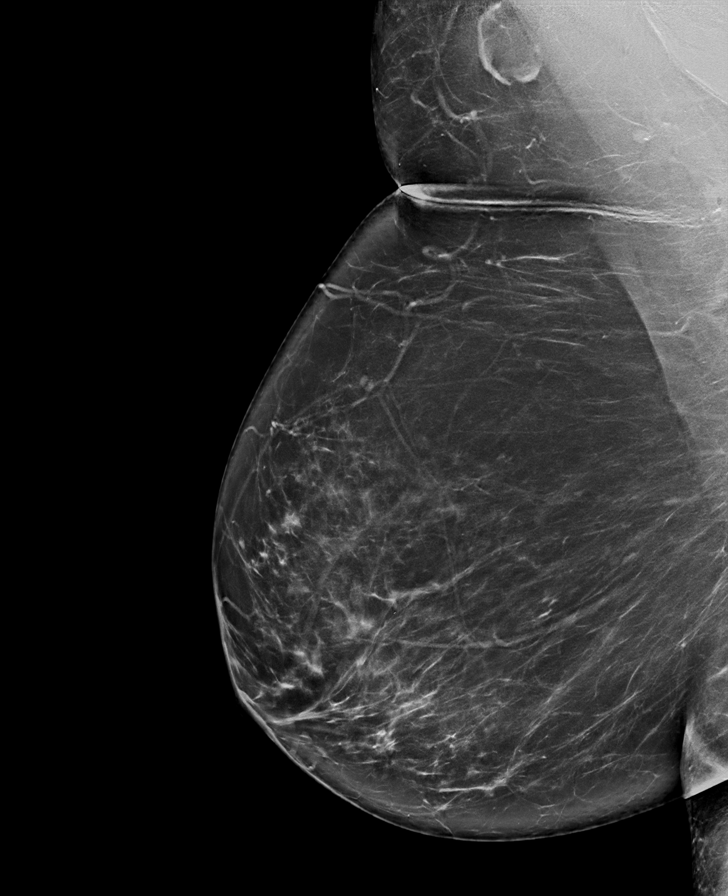

[L MLO synth-2D]
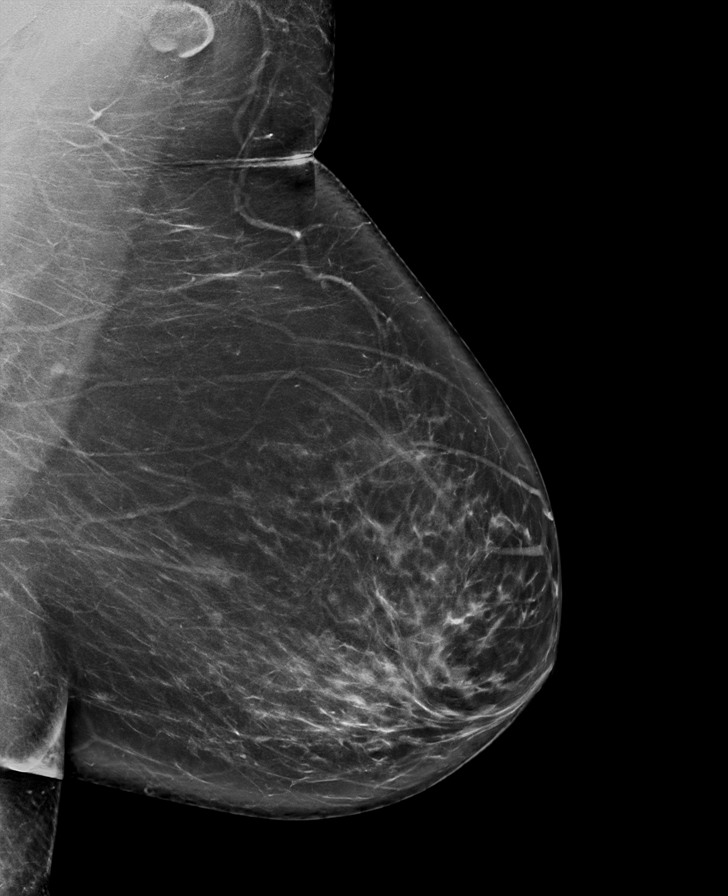

[L CC synth-2D]
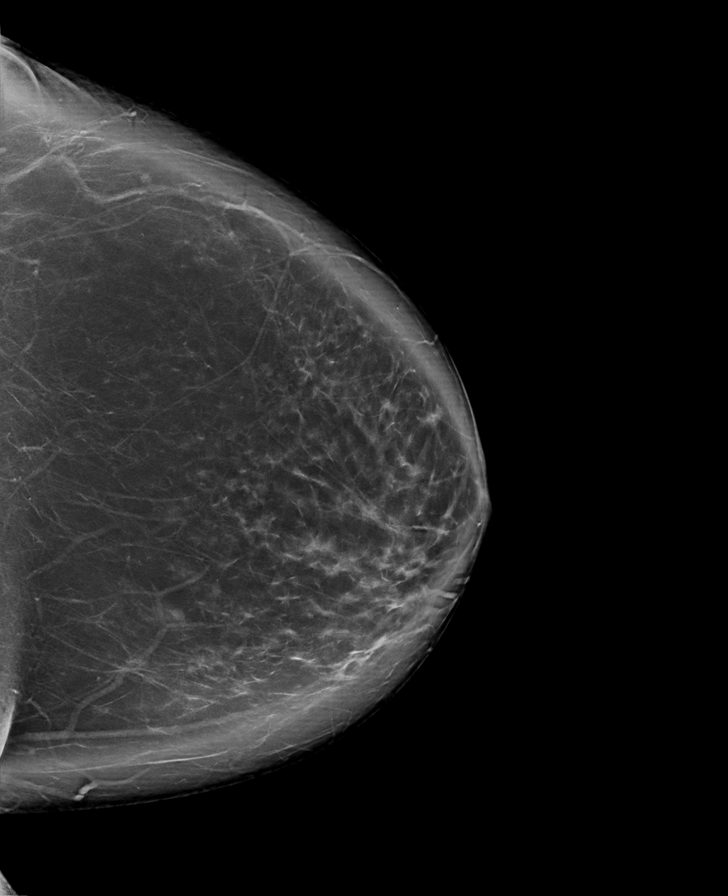

[R CC tomo · tomo slice 39/77.0]
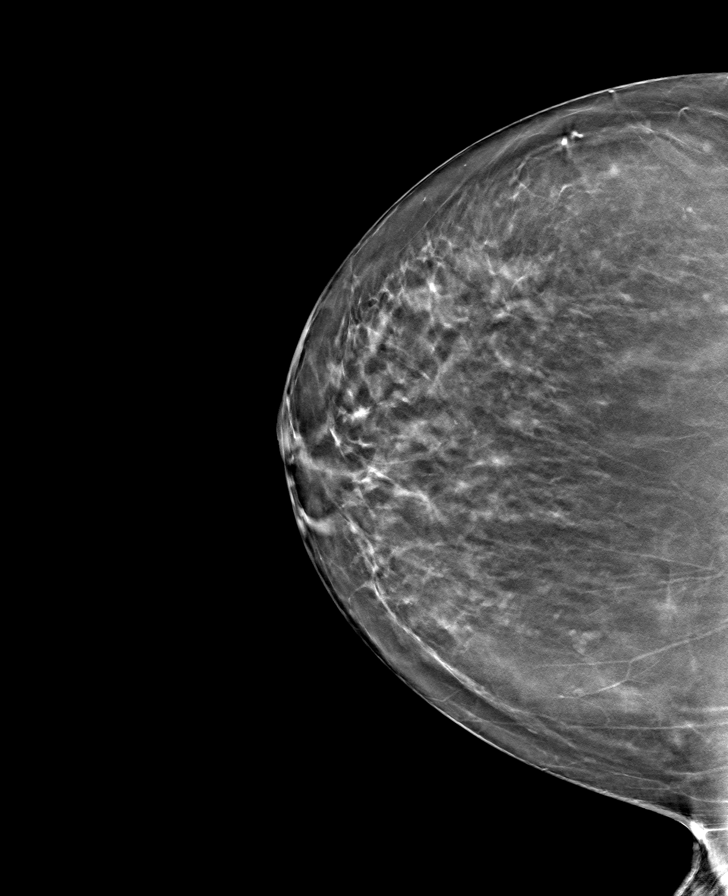

[L MLO tomo · tomo slice 43/86.0]
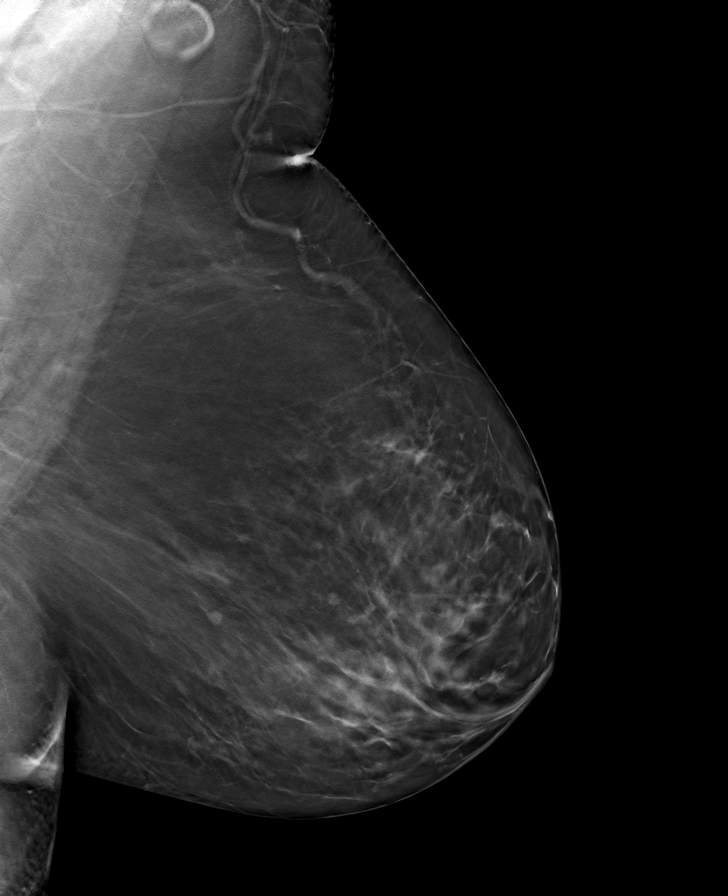

[L CC tomo · tomo slice 66/131.0]
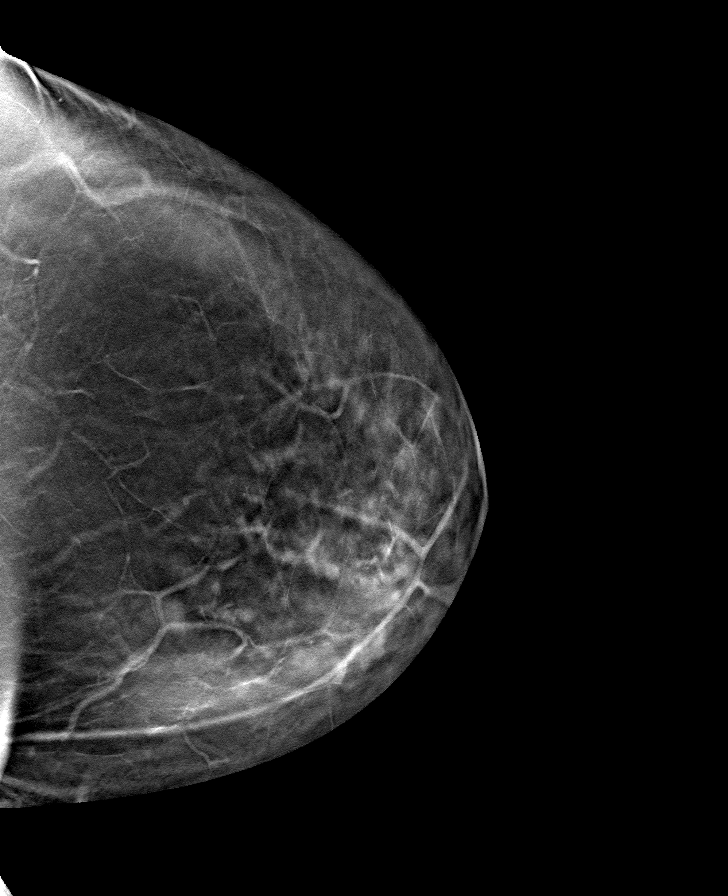

[R MLO tomo · tomo slice 45/90.0]
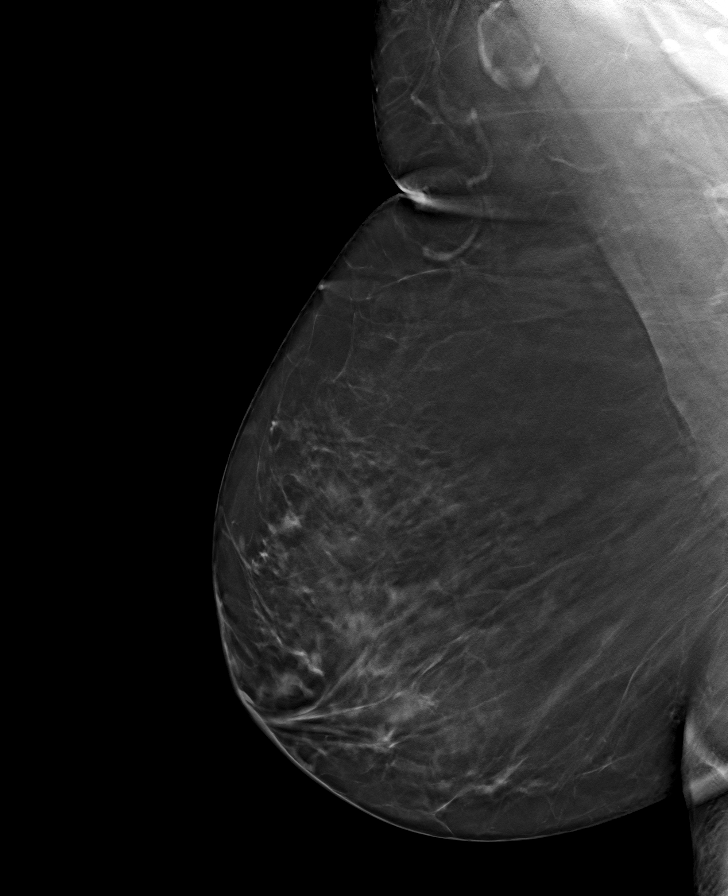

[8 of 24 positions shown; findings below may reference images not displayed]

ACR Breast Density Category b: There are scattered areas of
fibroglandular density.
FINDINGS: There are no findings suspicious for malignancy.
IMPRESSION: No mammographic evidence of malignancy. A result letter of this
screening mammogram will be mailed directly to the patient.

RECOMMENDATION:
Screening mammogram in one year. (Code:51-O-LD2)

BI-RADS CATEGORY  1: Negative.
